# Patient Record
Sex: Female | Born: 1950 | Race: Black or African American | Hispanic: No | Smoking: Never smoker
Health system: Southern US, Community
[De-identification: ages and names within clinical notes are randomized; demographics above are authoritative.]

## PROBLEM LIST (undated history)

## (undated) DIAGNOSIS — E785 Hyperlipidemia, unspecified: Secondary | ICD-10-CM

## (undated) DIAGNOSIS — K649 Unspecified hemorrhoids: Secondary | ICD-10-CM

## (undated) DIAGNOSIS — G5703 Lesion of sciatic nerve, bilateral lower limbs: Secondary | ICD-10-CM

## (undated) DIAGNOSIS — M1990 Unspecified osteoarthritis, unspecified site: Secondary | ICD-10-CM

## (undated) DIAGNOSIS — F419 Anxiety disorder, unspecified: Secondary | ICD-10-CM

## (undated) DIAGNOSIS — Z8619 Personal history of other infectious and parasitic diseases: Secondary | ICD-10-CM

## (undated) DIAGNOSIS — H8110 Benign paroxysmal vertigo, unspecified ear: Secondary | ICD-10-CM

## (undated) DIAGNOSIS — K219 Gastro-esophageal reflux disease without esophagitis: Secondary | ICD-10-CM

## (undated) DIAGNOSIS — F329 Major depressive disorder, single episode, unspecified: Secondary | ICD-10-CM

## (undated) DIAGNOSIS — F32A Depression, unspecified: Secondary | ICD-10-CM

## (undated) DIAGNOSIS — G4730 Sleep apnea, unspecified: Secondary | ICD-10-CM

## (undated) DIAGNOSIS — I10 Essential (primary) hypertension: Secondary | ICD-10-CM

## (undated) HISTORY — DX: Hyperlipidemia, unspecified: E78.5

## (undated) HISTORY — DX: Essential (primary) hypertension: I10

## (undated) HISTORY — DX: Unspecified osteoarthritis, unspecified site: M19.90

## (undated) HISTORY — DX: Anxiety disorder, unspecified: F41.9

## (undated) HISTORY — DX: Major depressive disorder, single episode, unspecified: F32.9

## (undated) HISTORY — DX: Depression, unspecified: F32.A

## (undated) HISTORY — PX: JOINT REPLACEMENT: SHX530

## (undated) HISTORY — DX: Sleep apnea, unspecified: G47.30

## (undated) HISTORY — PX: ABDOMINAL HYSTERECTOMY: SHX81

---

## 2005-11-26 HISTORY — PX: SHOULDER ARTHROSCOPY: SHX128

## 2007-11-27 HISTORY — PX: TOTAL KNEE ARTHROPLASTY: SHX125

## 2011-11-27 HISTORY — PX: BARIATRIC SURGERY: SHX1103

## 2015-03-01 ENCOUNTER — Ambulatory Visit (INDEPENDENT_AMBULATORY_CARE_PROVIDER_SITE_OTHER): Payer: BC Managed Care – PPO | Admitting: Internal Medicine

## 2015-03-01 ENCOUNTER — Encounter: Payer: Self-pay | Admitting: Internal Medicine

## 2015-03-01 ENCOUNTER — Ambulatory Visit (INDEPENDENT_AMBULATORY_CARE_PROVIDER_SITE_OTHER)
Admission: RE | Admit: 2015-03-01 | Discharge: 2015-03-01 | Disposition: A | Discharge status: Home / Self Care | Payer: BC Managed Care – PPO | Source: Ambulatory Visit | Attending: Internal Medicine | Admitting: Internal Medicine

## 2015-03-01 VITALS — BP 140/88 | HR 60 | Temp 98.4°F | Resp 14 | Ht 64.0 in | Wt 263.2 lb

## 2015-03-01 DIAGNOSIS — K219 Gastro-esophageal reflux disease without esophagitis: Secondary | ICD-10-CM

## 2015-03-01 DIAGNOSIS — M159 Polyosteoarthritis, unspecified: Secondary | ICD-10-CM

## 2015-03-01 DIAGNOSIS — G4733 Obstructive sleep apnea (adult) (pediatric): Secondary | ICD-10-CM

## 2015-03-01 DIAGNOSIS — M150 Primary generalized (osteo)arthritis: Secondary | ICD-10-CM

## 2015-03-01 DIAGNOSIS — M25552 Pain in left hip: Secondary | ICD-10-CM | POA: Diagnosis not present

## 2015-03-01 NOTE — Patient Instructions (Signed)
Please schedule a visit with the sports medicine doctor. We will take an x-ray today to make sure nothing has changed from 1 year ago.   We will have you go to the lung doctor about the CPAP, the surgeon about the gas problem, and an orthopedic surgeon for the knees.   Come back in about 6 months to check on the health (we will check the cholesterol then). Keep working on losing weight as every 1 pound lost takes 4 pounds of pressure off the knees.   For exercise water aerobics will be easier on the joints and can still give you a good workout.   Trochanteric Bursitis You have hip pain due to trochanteric bursitis. Bursitis means that the sack near the outside of the hip is filled with fluid and inflamed. This sack is made up of protective soft tissue. The pain from trochanteric bursitis can be severe and keep you from sleep. It can radiate to the buttocks or down the outside of the thigh to the knee. The pain is almost always worse when rising from the seated or lying position and with walking. Pain can improve after you take a few steps. It happens more often in people with hip joint and lumbar spine problems, such as arthritis or previous surgery. Very rarely the trochanteric bursa can become infected, and antibiotics and/or surgery may be needed. Treatment often includes an injection of local anesthetic mixed with cortisone medicine. This medicine is injected into the area where it is most tender over the hip. Repeat injections may be necessary if the response to treatment is slow. You can apply ice packs over the tender area for 30 minutes every 2 hours for the next few days. Anti-inflammatory and/or narcotic pain medicine may also be helpful. Limit your activity for the next few days if the pain continues. See your caregiver in 5-10 days if you are not greatly improved.  SEEK IMMEDIATE MEDICAL CARE IF:  You develop severe pain, fever, or increased redness.  You have pain that radiates below the  knee. EXERCISES STRETCHING EXERCISES - Trochanteric Bursitis  These exercises may help you when beginning to rehabilitate your injury. Your symptoms may resolve with or without further involvement from your physician, physical therapist, or athletic trainer. While completing these exercises, remember:   Restoring tissue flexibility helps normal motion to return to the joints. This allows healthier, less painful movement and activity.  An effective stretch should be held for at least 30 seconds.  A stretch should never be painful. You should only feel a gentle lengthening or release in the stretched tissue. STRETCH - Iliotibial Band  On the floor or bed, lie on your side so your injured leg is on top. Bend your knee and grab your ankle.  Slowly bring your knee back so that your thigh is in line with your trunk. Keep your heel at your buttocks and gently arch your back so your head, shoulders and hips line up.  Slowly lower your leg so that your knee approaches the floor/bed until you feel a gentle stretch on the outside of your thigh. If you do not feel a stretch and your knee will not fall farther, place the heel of your opposite foot on top of your knee and pull your thigh down farther.  Hold this stretch for __________ seconds.  Repeat __________ times. Complete this exercise __________ times per day. STRETCH - Hamstrings, Supine   Lie on your back. Loop a belt or towel over the  ball of your foot as shown.  Straighten your knee and slowly pull on the belt to raise your injured leg. Do not allow the knee to bend. Keep your opposite leg flat on the floor.  Raise the leg until you feel a gentle stretch behind your knee or thigh. Hold this position for __________ seconds.  Repeat __________ times. Complete this stretch __________ times per day. STRETCH - Quadriceps, Prone   Lie on your stomach on a firm surface, such as a bed or padded floor.  Bend your knee and grasp your ankle. If  you are unable to reach your ankle or pant leg, use a belt around your foot to lengthen your reach.  Gently pull your heel toward your buttocks. Your knee should not slide out to the side. You should feel a stretch in the front of your thigh and/or knee.  Hold this position for __________ seconds.  Repeat __________ times. Complete this stretch __________ times per day. STRETCHING - Hip Flexors, Lunge Half kneel with your knee on the floor and your opposite knee bent and directly over your ankle.  Keep good posture with your head over your shoulders. Tighten your buttocks to point your tailbone downward; this will prevent your back from arching too much.  You should feel a gentle stretch in the front of your thigh and/or hip. If you do not feel any resistance, slightly slide your opposite foot forward and then slowly lunge forward so your knee once again lines up over your ankle. Be sure your tailbone remains pointed downward.  Hold this stretch for __________ seconds.  Repeat __________ times. Complete this stretch __________ times per day. STRETCH - Adductors, Lunge  While standing, spread your legs.  Lean away from your injured leg by bending your opposite knee. You may rest your hands on your thigh for balance.  You should feel a stretch in your inner thigh. Hold for __________ seconds.  Repeat __________ times. Complete this exercise __________ times per day. Document Released: 12/20/2004 Document Revised: 03/29/2014 Document Reviewed: 02/24/2009 [facility] Patient Information 2015 [place]. This information is not intended to replace advice given to you by your health care provider. Make sure you discuss any questions you have with your health care provider.

## 2015-03-01 NOTE — Progress Notes (Signed)
Pre visit review using our clinic review tool, if applicable. No additional management support is needed unless otherwise documented below in the visit note. 

## 2015-03-03 ENCOUNTER — Telehealth: Payer: Self-pay | Admitting: Internal Medicine

## 2015-03-03 DIAGNOSIS — M25559 Pain in unspecified hip: Secondary | ICD-10-CM

## 2015-03-03 DIAGNOSIS — Z8669 Personal history of other diseases of the nervous system and sense organs: Secondary | ICD-10-CM | POA: Insufficient documentation

## 2015-03-03 DIAGNOSIS — K219 Gastro-esophageal reflux disease without esophagitis: Secondary | ICD-10-CM | POA: Insufficient documentation

## 2015-03-03 DIAGNOSIS — G4733 Obstructive sleep apnea (adult) (pediatric): Secondary | ICD-10-CM | POA: Insufficient documentation

## 2015-03-03 NOTE — Telephone Encounter (Signed)
Patient does not want to see Dr. Katrinka BlazingSmith or Sports med.

## 2015-03-03 NOTE — Assessment & Plan Note (Signed)
Referral to pulmonology as her CPAP needs some parts. She does have records from her pulmonologist which will be reviewed and scanned into records.

## 2015-03-03 NOTE — Telephone Encounter (Signed)
Will you please put in order for an orthopedic surgeon. This patient is in a hurry to get some treatment for her pain.

## 2015-03-03 NOTE — Telephone Encounter (Signed)
Done but still recommend seeing Dr. Katrinka BlazingSmith.

## 2015-03-03 NOTE — Assessment & Plan Note (Signed)
Per chart review history of hypertension but not hypertensive today and not on BP medication. Will check labs next time to check for complications of the obesity. Did talk with her about the fact that her weight could be causing her joint discomfort. She is aware but is not as focused on weight loss today with her acute pain. Will continue to discuss at next visit as she is pre-contemplative for changes today.

## 2015-03-03 NOTE — Telephone Encounter (Signed)
Patient called back.  She cancelled appointment with Dr. Katrinka BlazingSmith.  She states she only wants referral for Orthopedic surgeon.  She is requesting a call back in regards to what she can do for pain while she waits on referral and she would like referral to go through as soon as possible.

## 2015-03-03 NOTE — Telephone Encounter (Signed)
Left message informing patient that the orders have been put in for ortho.

## 2015-03-03 NOTE — Addendum Note (Signed)
Addended by: Genella MechKOLLAR, ELIZABETH A on: 03/03/2015 08:03 PM   Modules accepted: Level of Service

## 2015-03-03 NOTE — Assessment & Plan Note (Signed)
Feel could be trochanteric bursitis and recommend sports medicine for initial evaluation. Check pelvis and left hip x-ray for previously not found fractures from the fall. She can continue with naproxen for pain for now.

## 2015-03-03 NOTE — Progress Notes (Signed)
   Subjective:    Patient ID: Cindy Anthony, female    DOB: 08/06/1951, 64 y.o.   MRN: [id number]  HPI The patient is a 64 YO female who is coming in for pain in her pelvic and thigh area. She fell at work about 1 year ago and was told that she bruised her tailbone. She did feel somewhat better in the following months but she has not recovered. She has been using naproxen for pain at home which distracts her from the pain but does not take it away (from 6 to 3/10 with naproxen). She denies any numbness or weakness in her legs. She denies them giving out on her. Denies incontinence of urine or bowels. Denies fevers or chills.   [facility], social history reviewed and updated.   Review of Systems  Constitutional: Positive for appetite change. Negative for fever, activity change, fatigue and unexpected weight change.  HENT: Negative.   Eyes: Negative.   Respiratory: Negative for cough, chest tightness, shortness of breath and wheezing.   Cardiovascular: Negative for chest pain, palpitations and leg swelling.  Gastrointestinal: Negative for nausea, abdominal pain, diarrhea, constipation and abdominal distention.  Musculoskeletal: Positive for myalgias, back pain and arthralgias. Negative for gait problem.  Skin: Negative.   Neurological: Negative.   Psychiatric/Behavioral: Negative.       Objective:   Physical Exam  Constitutional: She is oriented to person, place, and time. She appears well-developed and well-nourished.  overweight  HENT:  Head: Normocephalic and atraumatic.  Eyes: EOM are normal.  Neck: Normal range of motion.  Cardiovascular: Normal rate and regular rhythm.   Pulmonary/Chest: Effort normal and breath sounds normal. No respiratory distress. She has no wheezes. She has no rales.  Abdominal: Soft. Bowel sounds are normal. She exhibits no distension. There is no tenderness. There is no rebound.  Musculoskeletal:  Most tenderness on the lateral thigh left leg but mild  tenderness in the left groin as well.   Neurological: She is alert and oriented to person, place, and time. Coordination normal.  Skin: Skin is warm and dry.   Filed Vitals:   03/01/15 0919  BP: 140/88  Pulse: 60  Temp: 98.4 F (36.9 C)  TempSrc: Oral  Resp: 14  Height: 5\' 4"  (1.626 m)  Weight: 263 lb 3.2 oz (119.387 kg)  SpO2: 97%      Assessment & Plan:

## 2015-03-03 NOTE — Assessment & Plan Note (Signed)
Takes omeprazole as needed for GERD symptoms. No warning signs to suggest need for endoscopy at this time.

## 2015-03-03 NOTE — Telephone Encounter (Signed)
Cindy Anthony got patient to contact front desk to see if patient could get in sooner with Dr. Katrinka Anthony.  Patient is in a lot of pain.  Did notify patient that Dr. Katrinka Anthony would not be back until the 12th.  Did offer to see if Dr. Dorise Anthony would enter a referral for another office.  Patient did not want to wait that long.  Please contact patient when back in the office.

## 2015-03-07 ENCOUNTER — Ambulatory Visit: Payer: Self-pay | Admitting: Internal Medicine

## 2015-03-18 ENCOUNTER — Ambulatory Visit: Payer: BC Managed Care – PPO | Admitting: Family Medicine

## 2015-03-22 ENCOUNTER — Telehealth: Payer: Self-pay | Admitting: Internal Medicine

## 2015-03-22 NOTE — Telephone Encounter (Signed)
Patient states she needs referral for GI.  States she got a call from a bariatric office stating there was a referral sent over from our office for her.  Patient states she did not need a referral for this.  Patient is requesting call back in regards.

## 2015-03-23 NOTE — Telephone Encounter (Signed)
Left patient a message to call me back.

## 2015-03-24 NOTE — Telephone Encounter (Signed)
Patient has bowel and gas problems. She thinks its coming from the bariatric surgery that was done a while ago. She is asking for a referral to GI.

## 2015-03-25 NOTE — Telephone Encounter (Signed)
We would like her to follow up with the surgeon to make sure there are no problems from her previous procedure and if no problems then GI referral is appropriate.

## 2015-04-06 ENCOUNTER — Encounter: Payer: Self-pay | Admitting: Internal Medicine

## 2015-04-11 ENCOUNTER — Encounter: Payer: Self-pay | Admitting: Internal Medicine

## 2015-05-07 ENCOUNTER — Encounter: Payer: Self-pay | Admitting: Emergency Medicine

## 2015-05-07 ENCOUNTER — Emergency Department
Admission: EM | Admit: 2015-05-07 | Discharge: 2015-05-07 | Discharge status: Against Medical Advice | Payer: BC Managed Care – PPO | Attending: Emergency Medicine | Admitting: Emergency Medicine

## 2015-05-07 ENCOUNTER — Other Ambulatory Visit: Payer: Self-pay

## 2015-05-07 DIAGNOSIS — I10 Essential (primary) hypertension: Secondary | ICD-10-CM | POA: Insufficient documentation

## 2015-05-07 DIAGNOSIS — R079 Chest pain, unspecified: Secondary | ICD-10-CM | POA: Diagnosis not present

## 2015-05-07 DIAGNOSIS — K088 Other specified disorders of teeth and supporting structures: Secondary | ICD-10-CM | POA: Insufficient documentation

## 2015-05-07 LAB — COMPREHENSIVE METABOLIC PANEL
ALBUMIN: 4.2 g/dL (ref 3.5–5.0)
ALT: 21 U/L (ref 14–54)
AST: 19 U/L (ref 15–41)
Alkaline Phosphatase: 83 U/L (ref 38–126)
Anion gap: 9 (ref 5–15)
BUN: 14 mg/dL (ref 6–20)
CALCIUM: 9.3 mg/dL (ref 8.9–10.3)
CHLORIDE: 105 mmol/L (ref 101–111)
CO2: 25 mmol/L (ref 22–32)
CREATININE: 0.76 mg/dL (ref 0.44–1.00)
GFR calc Af Amer: >60 mL/min (ref >60)
GFR calc non Af Amer: >60 mL/min (ref >60)
Glucose, Bld: 86 mg/dL (ref 65–99)
Potassium: 3.5 mmol/L (ref 3.5–5.1)
Sodium: 139 mmol/L (ref 135–145)
Total Bilirubin: 1 mg/dL (ref 0.3–1.2)
Total Protein: 7.4 g/dL (ref 6.5–8.1)

## 2015-05-07 LAB — CBC
HCT: 44.5 % (ref 35.0–47.0)
HEMOGLOBIN: 14.6 g/dL (ref 12.0–16.0)
MCH: 29.6 pg (ref 26.0–34.0)
MCHC: 32.7 g/dL (ref 32.0–36.0)
MCV: 90.6 fL (ref 80.0–100.0)
PLATELETS: 218 10*3/uL (ref 150–440)
RBC: 4.92 MIL/uL (ref 3.80–5.20)
RDW: 14.7 % — ABNORMAL HIGH (ref 11.5–14.5)
WBC: 7.2 10*3/uL (ref 3.6–11.0)

## 2015-05-07 LAB — TROPONIN I: Troponin I: <0.03 ng/mL (ref <0.031)

## 2015-05-07 NOTE — ED Notes (Signed)
Denies SOB.

## 2015-05-07 NOTE — ED Notes (Signed)
This Rn went in to check on pt. Pt stated " Are you the doctor?" this Rn stated that I ws her nurse and inquired if she needed anything. Pt stated " the doctor better hurry up or I am going to walk out of here."

## 2015-05-07 NOTE — ED Notes (Signed)
PT states having "on and off chest pain X 2 days." Pt denies chest pain at the moment. Pt reports having a filling in her lower, back left tooth that is causing her pain

## 2015-05-12 ENCOUNTER — Ambulatory Visit (INDEPENDENT_AMBULATORY_CARE_PROVIDER_SITE_OTHER): Payer: BC Managed Care – PPO | Admitting: Family Medicine

## 2015-05-12 ENCOUNTER — Encounter: Payer: Self-pay | Admitting: Family Medicine

## 2015-05-12 VITALS — BP 198/98 | HR 62 | Temp 98.1°F | Wt 259.0 lb

## 2015-05-12 DIAGNOSIS — I10 Essential (primary) hypertension: Secondary | ICD-10-CM | POA: Insufficient documentation

## 2015-05-12 DIAGNOSIS — M25552 Pain in left hip: Secondary | ICD-10-CM | POA: Diagnosis not present

## 2015-05-12 DIAGNOSIS — K0889 Other specified disorders of teeth and supporting structures: Secondary | ICD-10-CM

## 2015-05-12 DIAGNOSIS — K088 Other specified disorders of teeth and supporting structures: Secondary | ICD-10-CM

## 2015-05-12 MED ORDER — TRAMADOL HCL 50 MG PO TABS
50.0000 mg | ORAL_TABLET | Freq: Four times a day (QID) | ORAL | Status: DC | PRN
Start: 2015-05-12 — End: 2015-05-27

## 2015-05-12 MED ORDER — AMLODIPINE BESYLATE 5 MG PO TABS: 5.0000 mg | ORAL_TABLET | Freq: Every day | ORAL | Status: DC

## 2015-05-12 NOTE — Patient Instructions (Signed)
DASH Eating Plan DASH stands for "Dietary Approaches to Stop Hypertension." The DASH eating plan is a healthy eating plan that has been shown to reduce high blood pressure (hypertension). Additional health benefits may include reducing the risk of type 2 diabetes mellitus, heart disease, and stroke. The DASH eating plan may also help with weight loss. WHAT DO I NEED TO KNOW ABOUT THE DASH EATING PLAN? For the DASH eating plan, you will follow these general guidelines:  Choose foods with a percent daily value for sodium of less than 5% (as listed on the food label).  Use salt-free seasonings or herbs instead of table salt or sea salt.  Check with your health care provider or pharmacist before using salt substitutes.  Eat lower-sodium products, often labeled as "lower sodium" or "no salt added."  Eat fresh foods.  Eat more vegetables, fruits, and low-fat dairy products.  Choose whole grains. Look for the word "whole" as the first word in the ingredient list.  Choose fish and skinless chicken or turkey more often than red meat. Limit fish, poultry, and meat to 6 oz (170 g) each day.  Limit sweets, desserts, sugars, and sugary drinks.  Choose heart-healthy fats.  Limit cheese to 1 oz (28 g) per day.  Eat more home-cooked food and less restaurant, buffet, and fast food.  Limit fried foods.  Cook foods using methods other than frying.  Limit canned vegetables. If you do use them, rinse them well to decrease the sodium.  When eating at a restaurant, ask that your food be prepared with less salt, or no salt if possible. WHAT FOODS CAN I EAT? Seek help from a dietitian for individual calorie needs. Grains Whole grain or whole wheat bread. Brown rice. Whole grain or whole wheat pasta. Quinoa, bulgur, and whole grain cereals. Low-sodium cereals. Corn or whole wheat flour tortillas. Whole grain cornbread. Whole grain crackers. Low-sodium crackers. Vegetables Fresh or frozen vegetables  (raw, steamed, roasted, or grilled). Low-sodium or reduced-sodium tomato and vegetable juices. Low-sodium or reduced-sodium tomato sauce and paste. Low-sodium or reduced-sodium canned vegetables.  Fruits All fresh, canned (in natural juice), or frozen fruits. Meat and Other Protein Products Ground beef (85% or leaner), grass-fed beef, or beef trimmed of fat. Skinless chicken or turkey. Ground chicken or turkey. Pork trimmed of fat. All fish and seafood. Eggs. Dried beans, peas, or lentils. Unsalted nuts and seeds. Unsalted canned beans. Dairy Low-fat dairy products, such as skim or 1% milk, 2% or reduced-fat cheeses, low-fat ricotta or cottage cheese, or plain low-fat yogurt. Low-sodium or reduced-sodium cheeses. Fats and Oils Tub margarines without trans fats. Light or reduced-fat mayonnaise and salad dressings (reduced sodium). Avocado. Safflower, olive, or canola oils. Natural peanut or almond butter. Other Unsalted popcorn and pretzels. The items listed above may not be a complete list of recommended foods or beverages. Contact your dietitian for more options. WHAT FOODS ARE NOT RECOMMENDED? Grains White bread. White pasta. White rice. Refined cornbread. Bagels and croissants. Crackers that contain trans fat. Vegetables Creamed or fried vegetables. Vegetables in a cheese sauce. Regular canned vegetables. Regular canned tomato sauce and paste. Regular tomato and vegetable juices. Fruits Dried fruits. Canned fruit in light or heavy syrup. Fruit juice. Meat and Other Protein Products Fatty cuts of meat. Ribs, chicken wings, bacon, sausage, bologna, salami, chitterlings, fatback, hot dogs, bratwurst, and packaged luncheon meats. Salted nuts and seeds. Canned beans with salt. Dairy Whole or 2% milk, cream, half-and-half, and cream cheese. Whole-fat or sweetened yogurt. Full-fat   cheeses or blue cheese. Nondairy creamers and whipped toppings. Processed cheese, cheese spreads, or cheese  curds. Condiments Onion and garlic salt, seasoned salt, table salt, and sea salt. Canned and packaged gravies. Worcestershire sauce. Tartar sauce. Barbecue sauce. Teriyaki sauce. Soy sauce, including reduced sodium. Steak sauce. Fish sauce. Oyster sauce. Cocktail sauce. Horseradish. Ketchup and mustard. Meat flavorings and tenderizers. Bouillon cubes. Hot sauce. Tabasco sauce. Marinades. Taco seasonings. Relishes. Fats and Oils Butter, stick margarine, lard, shortening, ghee, and bacon fat. Coconut, palm kernel, or palm oils. Regular salad dressings. Other Pickles and olives. Salted popcorn and pretzels. The items listed above may not be a complete list of foods and beverages to avoid. Contact your dietitian for more information. WHERE CAN I FIND MORE INFORMATION? National Heart, Lung, and Blood Institute: [employer] Document Released: 11/01/2011 Document Revised: 03/29/2014 Document Reviewed: 09/16/2013 [facility] Patient Information 2015 [place], [place]. This information is not intended to replace advice given to you by your health care provider. Make sure you discuss any questions you have with your health care provider.  STOP the Indomethacin and do not take any other NSAIDS Make sure you follow up with your primary in 2-3 weeks for follow up.

## 2015-05-12 NOTE — Progress Notes (Signed)
Pre visit review using our clinic review tool, if applicable. No additional management support is needed unless otherwise documented below in the visit note. 

## 2015-05-12 NOTE — Progress Notes (Signed)
   Subjective:    Patient ID: Cindy Anthony, female    DOB: [birth year], 64 y.o.   MRN: [id number]  HPI Patient seen with concerns for elevated blood pressure. She has apparently had some borderline elevations in the past. She was placed recently on indomethacin per her orthopedist for some left hip pain and she thinks that may be exacerbating. She feels somewhat dizzy and lightheaded at times. Occasional headaches. No peripheral edema.  Patient also had root canal left lower molar just a few days ago. She's had some oral pains and was taking indomethacin for that as well. She is nonsmoker. No alcohol use. Tries to watch her sodium intake closely.  Past Medical History  Diagnosis Date  . Arthritis   . Depression   . Hyperlipidemia   . Hypertension    Past Surgical History  Procedure Laterality Date  . Abdominal hysterectomy    . Total knee arthroplasty Right 2009  . Shoulder arthroscopy Right 2007  . Bariatric surgery  2013    sleeve    reports that she has never smoked. She does not have any smokeless tobacco history on file. She reports that she does not drink alcohol or use illicit drugs. family history includes Arthritis in her father and mother; Diabetes in her mother and paternal grandmother; Heart disease in her father and mother; Hyperlipidemia in her mother; Hypertension in her mother; Kidney disease in her mother; Stroke in her mother. No Known Allergies    Review of Systems  Constitutional: Positive for fatigue.  Eyes: Negative for visual disturbance.  Respiratory: Negative for cough, chest tightness, shortness of breath and wheezing.   Cardiovascular: Negative for chest pain, palpitations and leg swelling.  Endocrine: Negative for polydipsia and polyuria.  Musculoskeletal: Positive for arthralgias.  Neurological: Positive for dizziness and light-headedness. Negative for seizures, syncope, weakness and headaches.       Objective:   Physical Exam  Constitutional:  She appears well-developed and well-nourished.  Eyes: Pupils are equal, round, and reactive to light.  Neck: Neck supple. No thyromegaly present.  Cardiovascular: Normal rate and regular rhythm.  Exam reveals no gallop.   Pulmonary/Chest: Effort normal and breath sounds normal. No respiratory distress. She has no wheezes. She has no rales.  Musculoskeletal: She exhibits no edema.  Lymphadenopathy:    She has no cervical adenopathy.          Assessment & Plan:  #1 elevated blood pressure. Initial reading today by nurse 160/92 and I obtained 198/98 left arm seated at rest and same reading right arm seated at rest. Probably exacerbated by indomethacin. Information on DASH diet given. Given degree of elevation today go ahead and start amlodipine 5 mg once daily. Strongly encouraged to follow-up with primary in 2-3 weeks. Avoid all other non-steroidal's. #2 recent left hip and tooth pain. Avoid NSAIDS as above. Limited tramadol 50 mg 1-2 every 6 hours as needed for severe pain

## 2015-05-13 ENCOUNTER — Ambulatory Visit: Payer: BC Managed Care – PPO | Admitting: Family Medicine

## 2015-05-13 ENCOUNTER — Telehealth: Payer: Self-pay | Admitting: Internal Medicine

## 2015-05-13 NOTE — Telephone Encounter (Signed)
Patient informed. 

## 2015-05-13 NOTE — Telephone Encounter (Signed)
I wish that I could but can not take on any new patients at this time

## 2015-05-13 NOTE — Telephone Encounter (Signed)
Patient seen Dr. Caryl Never yesterday and is requesting to transfer to him.

## 2015-05-25 ENCOUNTER — Ambulatory Visit (INDEPENDENT_AMBULATORY_CARE_PROVIDER_SITE_OTHER): Payer: BC Managed Care – PPO | Admitting: Pulmonary Disease

## 2015-05-25 ENCOUNTER — Encounter: Payer: Self-pay | Admitting: Pulmonary Disease

## 2015-05-25 VITALS — BP 120/78 | HR 62 | Ht 64.0 in | Wt 258.8 lb

## 2015-05-25 DIAGNOSIS — G4733 Obstructive sleep apnea (adult) (pediatric): Secondary | ICD-10-CM

## 2015-05-25 NOTE — Progress Notes (Signed)
Subjective:    Patient ID: Cindy Anthony, female    DOB: 08/08/1951, 64 y.o.   MRN: [id number]  HPI  64 year old presents to establish care for obstructive sleep apnea. [place] works as a Education officer, environmentalcollege instructor. She was diagnosed with OSA in 2008, maintained on CPAP 7 cm with a small nasal mask. She has a Cabin crewfisher paykel machine,which has gotten old, DME was [facility]-she would like to change. She underwent gastric bypass in 2010 and lost about 15 pounds, her peak weight was 288 she is now down to 259.  Epworth sleepiness score is 2 Bedtime is around 11 PM, sleep latency about 30 minutes, to an hour she sleeps on her side with 2 pillows, head of bed is raised, no nocturnal awakenings or nocturia and is out of bed by 7 AM rested without dryness of mouth. There is no history suggestive of cataplexy, sleep paralysis or parasomnias   Past Medical History  Diagnosis Date  . Arthritis   . Depression   . Hyperlipidemia   . Hypertension     Past Surgical History  Procedure Laterality Date  . Abdominal hysterectomy    . Total knee arthroplasty Right 2009  . Shoulder arthroscopy Right 2007  . Bariatric surgery  2013    sleeve   No Known Allergies  History   Social History  . Marital Status: Divorced    Spouse Name: N/A  . Number of Children: N/A  . Years of Education: N/A   Occupational History  . Not on file.   Social History Main Topics  . Smoking status: Never Smoker   . Smokeless tobacco: Not on file  . Alcohol Use: No  . Drug Use: No  . Sexual Activity: Not on file   Other Topics Concern  . Not on file   Social History Narrative    Family History  Problem Relation Age of Onset  . Arthritis Mother   . Hyperlipidemia Mother   . Heart disease Mother   . Stroke Mother   . Hypertension Mother   . Kidney disease Mother   . Diabetes Mother   . Arthritis Father   . Heart disease Father   . Diabetes Paternal Grandmother        Review of Systems  Constitutional: Negative for fever, chills and unexpected weight change.  HENT: Negative for congestion, dental problem, ear pain, nosebleeds, postnasal drip, rhinorrhea, sinus pressure, sneezing, sore throat, trouble swallowing and voice change.   Eyes: Negative for visual disturbance.  Respiratory: Positive for apnea. Negative for cough, choking and shortness of breath.   Cardiovascular: Negative for chest pain and leg swelling.  Gastrointestinal: Negative for vomiting, abdominal pain and diarrhea.  Genitourinary: Negative for difficulty urinating.  Musculoskeletal: Negative for arthralgias.  Skin: Negative for rash.  Neurological: Negative for tremors, syncope and headaches.  Hematological: Does not bruise/bleed easily.       Objective:   Physical Exam  Gen. Pleasant, obese, in no distress, normal affect ENT - no lesions, no post nasal drip, class 2-3 airway Neck: No JVD, no thyromegaly, no carotid bruits Lungs: no use of accessory muscles, no dullness to percussion, decreased without rales or rhonchi  Cardiovascular: Rhythm regular, heart sounds  normal, no murmurs or gallops, no peripheral edema Abdomen: soft and non-tender, no hepatosplenomegaly, BS normal. Musculoskeletal: No deformities, no cyanosis or clubbing Neuro:  alert, non focal, no tremors       Assessment & Plan:

## 2015-05-25 NOTE — Patient Instructions (Signed)
Obtain sleep studies from Dr Allena KatzPatel [facility] [place] Based on this, we will send Rx for new CPAP to local DME & get you new supplies

## 2015-05-25 NOTE — Assessment & Plan Note (Signed)
Obtain sleep studies from Dr Allena KatzPatel [facility] [place] Based on this, we will send Rx for new CPAP to local DME & get you new supplies  Weight loss encouraged, compliance with goal of at least 4-6 hrs every night is the expectation. Advised against medications with sedative side effects Cautioned against driving when sleepy - understanding that sleepiness will vary on a day to day basis

## 2015-05-27 ENCOUNTER — Encounter: Payer: Self-pay | Admitting: Internal Medicine

## 2015-05-27 ENCOUNTER — Ambulatory Visit (INDEPENDENT_AMBULATORY_CARE_PROVIDER_SITE_OTHER): Payer: BC Managed Care – PPO | Admitting: Internal Medicine

## 2015-05-27 VITALS — BP 136/76 | HR 68 | Temp 98.4°F | Resp 14 | Ht 64.0 in | Wt 258.0 lb

## 2015-05-27 DIAGNOSIS — I10 Essential (primary) hypertension: Secondary | ICD-10-CM | POA: Diagnosis not present

## 2015-05-27 NOTE — Assessment & Plan Note (Signed)
She does not wish to continue with medication if not needed. Advised that we can observe her off and she will check pressures at home and we can restart the amlodipine if she is going up. Talked to her about continuing with weight loss which will help keep her off medicine. No need for BMP today.

## 2015-05-27 NOTE — Progress Notes (Signed)
Pre visit review using our clinic review tool, if applicable. No additional management support is needed unless otherwise documented below in the visit note. 

## 2015-05-27 NOTE — Patient Instructions (Signed)
When you run out of the amlodipine you can stop taking it. Check the blood pressure a couple times a week and if it starts to go up to 150 or 160 or higher go back on the medicine.   Think about eating smaller meals to help with the gas and bloating.   Come back as scheduled late fall for a check up.   Keep up the good work with the weight! You are doing great!

## 2015-05-27 NOTE — Progress Notes (Signed)
   Subjective:    Patient ID: Cindy Anthony, female    DOB: 08/17/1951, 64 y.o.   MRN: [id number]  HPI The patient is a 64 YO female who is coming in for follow up of her blood pressure. She was treated with NSAID for hip pain which elevated her pressure. She is no longer on that medicine and hip doing better. She was started on amlodipine and she wants to know if she needs to continue. She is down about 5 pounds since last visit. No headaches, chest pains.   Review of Systems  Constitutional: Positive for appetite change. Negative for fever, activity change, fatigue and unexpected weight change.  Respiratory: Negative for cough, chest tightness, shortness of breath and wheezing.   Cardiovascular: Negative for chest pain, palpitations and leg swelling.  Gastrointestinal: Negative for nausea, abdominal pain, diarrhea, constipation and abdominal distention.  Musculoskeletal: Positive for myalgias. Negative for gait problem.  Neurological: Negative.       Objective:   Physical Exam  Constitutional: She is oriented to person, place, and time. She appears well-developed and well-nourished.  overweight  HENT:  Head: Normocephalic and atraumatic.  Eyes: EOM are normal.  Neck: Normal range of motion.  Cardiovascular: Normal rate and regular rhythm.   Pulmonary/Chest: Effort normal and breath sounds normal. No respiratory distress. She has no wheezes. She has no rales.  Abdominal: Soft. Bowel sounds are normal. She exhibits no distension. There is no tenderness. There is no rebound.  Neurological: She is alert and oriented to person, place, and time. Coordination normal.  Skin: Skin is warm and dry.   Filed Vitals:   05/27/15 1320  BP: 136/76  Pulse: 68  Temp: 98.4 F (36.9 C)  TempSrc: Oral  Resp: 14  Height: 5\' 4"  (1.626 m)  Weight: 258 lb (117.028 kg)  SpO2: 98%      Assessment & Plan:

## 2015-05-31 ENCOUNTER — Telehealth: Payer: Self-pay | Admitting: Pulmonary Disease

## 2015-05-31 DIAGNOSIS — G4733 Obstructive sleep apnea (adult) (pediatric): Secondary | ICD-10-CM

## 2015-05-31 NOTE — Telephone Encounter (Signed)
lmtcb

## 2015-05-31 NOTE — Telephone Encounter (Signed)
02/2009  CPAP titration >> 7 cm Pl [facility]

## 2015-06-01 NOTE — Telephone Encounter (Signed)
Patient notified. CPAP ordered. Nothing further needed.

## 2015-06-01 NOTE — Telephone Encounter (Signed)
[phone] or [phone], pt cb

## 2015-06-08 ENCOUNTER — Encounter: Payer: Self-pay | Admitting: Internal Medicine

## 2015-06-08 ENCOUNTER — Ambulatory Visit (INDEPENDENT_AMBULATORY_CARE_PROVIDER_SITE_OTHER): Payer: BC Managed Care – PPO | Admitting: Internal Medicine

## 2015-06-08 VITALS — BP 128/80 | HR 58 | Ht 63.0 in | Wt 257.2 lb

## 2015-06-08 DIAGNOSIS — E669 Obesity, unspecified: Secondary | ICD-10-CM

## 2015-06-08 DIAGNOSIS — R140 Abdominal distension (gaseous): Secondary | ICD-10-CM | POA: Diagnosis not present

## 2015-06-08 DIAGNOSIS — K219 Gastro-esophageal reflux disease without esophagitis: Secondary | ICD-10-CM | POA: Diagnosis not present

## 2015-06-08 DIAGNOSIS — R143 Flatulence: Secondary | ICD-10-CM | POA: Diagnosis not present

## 2015-06-08 MED ORDER — RIFAXIMIN 550 MG PO TABS: 550.0000 mg | ORAL_TABLET | Freq: Three times a day (TID) | ORAL | Status: DC

## 2015-06-08 NOTE — Progress Notes (Signed)
HISTORY OF PRESENT ILLNESS:  Cindy Anthony is a 64 y.o. female, native of [place], with general medical problems as listed below who is referred today by her primary care provider, Dr. Genella MechElizabeth Kollar, regarding complaints of increased intestinal gas manifested principally by belching and flatus. The [place] in February 2016, from [place], as a Education officer, environmentalcollege instructor. The majority of her health [place] [facility] [place], but no records available for review (have been requested). She tells me that she underwent bariatric surgery in the form of gastric sleeve in 2011. Unfortunately, no meaningful weight loss thereafter. Since that time she does report problems with increased intestinal gas as manifested by belching and foul-smelling flatus. She also has a history of GERD as manifested by intermittent pyrosis for which she takes on demand omeprazole with success. No dysphagia. Her weight over the past year has been stable. She has had several colonoscopies in the past. She denies a history of polyps. No family history of colon cancer. She tells me that her last colonoscopy in 2015 was normal. The patient denies abdominal pain or bleeding. I have reviewed outside records including her primary care providers office note and laboratories. Blood work from 05/07/2015 reveals normal CBC and comprehensive metabolic panel. The patient also reports having undergone upper endoscopy and [facility] [place]  REVIEW OF SYSTEMS:  All non-GI ROS negative except for anxiety, arthritis  Past Medical History  Diagnosis Date  . Arthritis   . Depression   . Hyperlipidemia   . Hypertension   . Anxiety   . Sleep apnea     Past Surgical History  Procedure Laterality Date  . Abdominal hysterectomy    . Total knee arthroplasty Right 2009  . Shoulder arthroscopy Right 2007  . Bariatric surgery  2013    sleeve    Social History Cindy Anthony  reports that she has never smoked. She  does not have any smokeless tobacco history on file. She reports that she does not drink alcohol or use illicit drugs.  family history includes Arthritis in her father and mother; Colon polyps in her mother; Diabetes in her mother and paternal grandmother; Heart disease in her father and mother; Hyperlipidemia in her mother; Hypertension in her mother; Kidney disease in her mother; Stroke in her mother.  No Known Allergies     PHYSICAL EXAMINATION: Vital signs: BP 128/80 mmHg  Pulse 58  Ht 5\' 3"  (1.6 m)  Wt 257 lb 4 oz (116.688 kg)  BMI 45.58 kg/m2  Constitutional: generally well-appearing, no acute distress Psychiatric: alert and oriented x3, cooperative Eyes: extraocular movements intact, anicteric, conjunctiva pink Mouth: oral pharynx moist, no lesions Neck: supple no lymphadenopathy Cardiovascular: heart regular rate and rhythm, no murmur Lungs: clear to auscultation bilaterally Abdomen: soft, obese, nontender, nondistended, no obvious ascites, no peritoneal signs, normal bowel sounds, no organomegaly Rectal: Deferred Extremities: no clubbing cyanosis or lower extremity edema bilaterally Skin: no lesions on visible extremities Neuro: No focal deficits. No asterixis.  ASSESSMENT:  #1. Increased intestinal gas. May have an element of bacterial overgrowth #2. Morbid obesity. Prior gastric sleeve surgery 2011 without resultant weight loss  #3. GERD without alarm features. Managed with on demand PPI #4. Reports colon cancer screening via colonoscopy up-to-date. [place] 2015 [place]  PLAN:  #1. Extensive discussion on the pathophysiology and clinical relevance of increased intestinal gas #2. Provided her with literature on intestinal gas for review #3. Provided her with literature on anti-gas and  flatulence dietary sheet #4. Recommended reflux precautions for GERD. Attention to weight loss #5. Recommended ongoing on demand use of PPI to control intermittent  GERD symptoms #6. Prescribe a trial of Xifaxan 550 mg 3 times a day 2 weeks for possible bacterial overgrowth #7. Review outside records from [place] if they become available #8. Return to the care of your PCP. Interval GI follow-up as needed  A copy of this consultation note has been sent to Dr. Dorise Hiss

## 2015-06-08 NOTE — Patient Instructions (Signed)
We have sent the following medications to your pharmacy for you to pick up at your convenience:  Xifaxan  You have been given some information on gas. 

## 2015-06-15 ENCOUNTER — Telehealth: Payer: Self-pay | Admitting: Pulmonary Disease

## 2015-06-15 NOTE — Telephone Encounter (Signed)
lmtcb X1 for Melissa.  [facility], was only given options to three separate offices.  Wcb.

## 2015-06-16 NOTE — Telephone Encounter (Signed)
Spoke with [facility], states that they have been unsuccessful in getting in touch with [facility] in [place] to retrieve the patients sleep study.  Melissa states that they have called 3 times and left 3 messages to return call and have not heard back. Requests that either the patient or our office attempt to contact the sleep center.  [facility] # [phone] ---- Called [facility] and spoke with rep, sleep study is being faxed to main fax #.  Will hold in triage to keep an eye out to ensure that [facility] gets this study asap.

## 2015-06-16 NOTE — Telephone Encounter (Signed)
lmtcb X2 for [employer]

## 2015-06-17 ENCOUNTER — Telehealth: Payer: Self-pay | Admitting: Internal Medicine

## 2015-06-17 NOTE — Telephone Encounter (Signed)
Yes, in Dr. Reginia Naas folder to be reviewed.

## 2015-06-17 NOTE — Telephone Encounter (Signed)
Cindy Anthony, did you receive results?

## 2015-06-21 NOTE — Telephone Encounter (Signed)
Left detailed message on voicemail advising of pressure setting. Nothing further needed.

## 2015-06-21 NOTE — Telephone Encounter (Signed)
error 

## 2015-06-21 NOTE — Telephone Encounter (Signed)
Reviewed -CPAP 7

## 2015-06-30 ENCOUNTER — Telehealth: Payer: Self-pay | Admitting: Pulmonary Disease

## 2015-06-30 ENCOUNTER — Ambulatory Visit (INDEPENDENT_AMBULATORY_CARE_PROVIDER_SITE_OTHER): Payer: BC Managed Care – PPO | Admitting: Internal Medicine

## 2015-06-30 ENCOUNTER — Other Ambulatory Visit (INDEPENDENT_AMBULATORY_CARE_PROVIDER_SITE_OTHER): Payer: BC Managed Care – PPO

## 2015-06-30 ENCOUNTER — Encounter: Payer: Self-pay | Admitting: Internal Medicine

## 2015-06-30 VITALS — BP 118/72 | HR 66 | Temp 98.5°F | Resp 16 | Ht 63.0 in | Wt 260.0 lb

## 2015-06-30 DIAGNOSIS — G8929 Other chronic pain: Secondary | ICD-10-CM | POA: Diagnosis not present

## 2015-06-30 DIAGNOSIS — R1012 Left upper quadrant pain: Secondary | ICD-10-CM | POA: Diagnosis not present

## 2015-06-30 DIAGNOSIS — R8271 Bacteriuria: Secondary | ICD-10-CM

## 2015-06-30 DIAGNOSIS — R8281 Pyuria: Secondary | ICD-10-CM

## 2015-06-30 DIAGNOSIS — N390 Urinary tract infection, site not specified: Secondary | ICD-10-CM | POA: Diagnosis not present

## 2015-06-30 DIAGNOSIS — R109 Unspecified abdominal pain: Principal | ICD-10-CM

## 2015-06-30 LAB — CBC WITH DIFFERENTIAL/PLATELET
BASOS ABS: 0 10*3/uL (ref 0.0–0.1)
Basophils Relative: 0.2 % (ref 0.0–3.0)
EOS PCT: 0.7 % (ref 0.0–5.0)
Eosinophils Absolute: 0.1 10*3/uL (ref 0.0–0.7)
HCT: 46 % (ref 36.0–46.0)
Hemoglobin: 15.3 g/dL — ABNORMAL HIGH (ref 12.0–15.0)
Lymphocytes Relative: 26.7 % (ref 12.0–46.0)
Lymphs Abs: 2.4 10*3/uL (ref 0.7–4.0)
MCHC: 33.2 g/dL (ref 30.0–36.0)
MCV: 90.5 fl (ref 78.0–100.0)
MONOS PCT: 7.7 % (ref 3.0–12.0)
Monocytes Absolute: 0.7 10*3/uL (ref 0.1–1.0)
NEUTROS PCT: 64.7 % (ref 43.0–77.0)
Neutro Abs: 5.7 10*3/uL (ref 1.4–7.7)
Platelets: 241 10*3/uL (ref 150.0–400.0)
RBC: 5.08 Mil/uL (ref 3.87–5.11)
RDW: 15.3 % (ref 11.5–15.5)
WBC: 8.8 10*3/uL (ref 4.0–10.5)

## 2015-06-30 LAB — COMPREHENSIVE METABOLIC PANEL
ALK PHOS: 104 U/L (ref 39–117)
ALT: 17 U/L (ref 0–35)
AST: 16 U/L (ref 0–37)
Albumin: 4.2 g/dL (ref 3.5–5.2)
BUN: 21 mg/dL (ref 6–23)
CALCIUM: 9.4 mg/dL (ref 8.4–10.5)
CHLORIDE: 105 meq/L (ref 96–112)
CO2: 27 mEq/L (ref 19–32)
CREATININE: 0.84 mg/dL (ref 0.40–1.20)
GFR: 87.65 mL/min (ref >60.00)
Glucose, Bld: 79 mg/dL (ref 70–99)
Potassium: 4.1 mEq/L (ref 3.5–5.1)
Sodium: 140 mEq/L (ref 135–145)
TOTAL PROTEIN: 7.4 g/dL (ref 6.0–8.3)
Total Bilirubin: 0.6 mg/dL (ref 0.2–1.2)

## 2015-06-30 LAB — URINALYSIS, ROUTINE W REFLEX MICROSCOPIC
Bilirubin Urine: NEGATIVE
HGB URINE DIPSTICK: NEGATIVE
Ketones, ur: NEGATIVE
NITRITE: NEGATIVE
Specific Gravity, Urine: 1.025 (ref 1.000–1.030)
TOTAL PROTEIN, URINE-UPE24: NEGATIVE
UROBILINOGEN UA: 0.2 (ref 0.0–1.0)
Urine Glucose: NEGATIVE
pH: 7 (ref 5.0–8.0)

## 2015-06-30 NOTE — Progress Notes (Signed)
Subjective:  Patient ID: Cindy Anthony, female    DOB: [birth year]  Age: 64 y.o. MRN: [id number]  CC: Flank Pain  New to me HPI Cindy Anthony presents for left flank pain for 3 months. She describes an intermittent sharp sensation that radiates into her left groin. She also has chronic left hip pain and she tells me she has been seeing an orthopedic surgeon and has recently had a steroid injection in her left hip.  Outpatient Prescriptions Prior to Visit  Medication Sig Dispense Refill  . LORazepam (ATIVAN) 0.5 MG tablet Take 0.5 mg by mouth 2 (two) times daily.    . naproxen (NAPROSYN) 500 MG tablet Take 500 mg by mouth as needed.    Marland Kitchen omeprazole (PRILOSEC) 40 MG capsule Take 40 mg by mouth daily.    Marland Kitchen amLODipine (NORVASC) 5 MG tablet Take 1 tablet (5 mg total) by mouth daily. 30 tablet 6  . rifaximin (XIFAXAN) 550 MG TABS tablet Take 1 tablet (550 mg total) by mouth 3 (three) times daily. 42 tablet 0   No facility-administered medications prior to visit.    ROS Review of Systems  Constitutional: Negative.  Negative for fever, chills, diaphoresis, activity change, appetite change, fatigue and unexpected weight change.  HENT: Negative.  Negative for trouble swallowing and voice change.   Eyes: Negative.   Respiratory: Negative.  Negative for cough, choking, chest tightness, shortness of breath and stridor.   Cardiovascular: Negative.  Negative for chest pain, palpitations and leg swelling.  Gastrointestinal: Positive for nausea. Negative for vomiting, abdominal pain, diarrhea, constipation and blood in stool.  Endocrine: Negative.   Genitourinary: Positive for flank pain. Negative for dysuria, urgency, frequency, hematuria, decreased urine volume, vaginal discharge, difficulty urinating, vaginal pain and menstrual problem.  Musculoskeletal: Positive for arthralgias. Negative for myalgias, back pain, joint swelling, gait problem and neck pain.  Skin: Negative.  Negative for rash.    Allergic/Immunologic: Negative.   Neurological: Negative.   Hematological: Negative for adenopathy. Does not bruise/bleed easily.  Psychiatric/Behavioral: Negative.     Objective:  BP 118/72 mmHg  Pulse 66  Temp(Src) 98.5 F (36.9 C) (Oral)  Resp 16  Ht 5\' 3"  (1.6 m)  Wt 260 lb (117.935 kg)  BMI 46.07 kg/m2  SpO2 98%  BP Readings from Last 3 Encounters:  06/30/15 118/72  06/08/15 128/80  05/27/15 136/76    Wt Readings from Last 3 Encounters:  06/30/15 260 lb (117.935 kg)  06/08/15 257 lb 4 oz (116.688 kg)  05/27/15 258 lb (117.028 kg)    Physical Exam  Constitutional: She is oriented to person, place, and time. She appears well-developed and well-nourished. No distress.  HENT:  Mouth/Throat: Oropharynx is clear and moist. No oropharyngeal exudate.  Eyes: Conjunctivae are normal. Right eye exhibits no discharge. Left eye exhibits no discharge. No scleral icterus.  Neck: Normal range of motion. Neck supple. No JVD present. No tracheal deviation present. No thyromegaly present.  Cardiovascular: Normal rate, regular rhythm, normal heart sounds and intact distal pulses.  Exam reveals no gallop and no friction rub.   No murmur heard. Pulmonary/Chest: Effort normal and breath sounds normal. No stridor. No respiratory distress. She has no wheezes. She has no rales. She exhibits no tenderness.  Abdominal: Soft. Normal appearance and bowel sounds are normal. She exhibits no distension and no mass. There is no splenomegaly or hepatomegaly. There is no tenderness. There is no rebound, no guarding and no CVA tenderness. No hernia. Hernia confirmed negative in the ventral  area.  Musculoskeletal: Normal range of motion. She exhibits no edema or tenderness.  Lymphadenopathy:    She has no cervical adenopathy.  Neurological: She is oriented to person, place, and time.  Skin: Skin is warm and dry. No rash noted. She is not diaphoretic. No erythema. No pallor.  Vitals reviewed.   Lab  Results  Component Value Date   WBC 8.8 06/30/2015   HGB 15.3* 06/30/2015   HCT 46.0 06/30/2015   PLT 241.0 06/30/2015   GLUCOSE 79 06/30/2015   ALT 17 06/30/2015   AST 16 06/30/2015   NA 140 06/30/2015   K 4.1 06/30/2015   CL 105 06/30/2015   CREATININE 0.84 06/30/2015   BUN 21 06/30/2015   CO2 27 06/30/2015    No results found.  Assessment & Plan:   Cindy Anthony was seen today for flank pain.  Diagnoses and all orders for this visit:  Left flank pain, chronic- she has had left flank pain for 3 months. She has a few white blood cells in her urine. I have asked her to come back in for urine culture and have ordered a CT scan of the abdomen and pelvis to see if she has an abscess or lesion in her left kidney. Orders: -     Comprehensive metabolic panel; Future -     CBC with Differential/Platelet; Future -     Urinalysis, Routine w reflex microscopic (not at [facility]); Future  I have discontinued Cindy Anthony's amLODipine and rifaximin. I am also having her maintain her omeprazole, LORazepam, and naproxen.  No orders of the defined types were placed in this encounter.     Follow-up: Return in about 3 weeks (around 07/21/2015).  Sanda Linger, MD

## 2015-06-30 NOTE — Progress Notes (Signed)
Pre visit review using our clinic review tool, if applicable. No additional management support is needed unless otherwise documented below in the visit note. 

## 2015-06-30 NOTE — Telephone Encounter (Signed)
Pt showed up b/c [facility] never received her sleep study we received from [place] that we received on 06/17/15.  I have faxed this to [facility]. Pt is aware and was very grateful of this. Staff message sent to [facility] letting her know.

## 2015-06-30 NOTE — Patient Instructions (Signed)
Flank Pain °Flank pain refers to pain that is located on the side of the body between the upper abdomen and the back. The pain may occur over a short period of time (acute) or may be long-term or reoccurring (chronic). It may be mild or severe. Flank pain can be caused by many things. °CAUSES  °Some of the more common causes of flank pain include: °· Muscle strains.   °· Muscle spasms.   °· A disease of your spine (vertebral disk disease).   °· A lung infection (pneumonia).   °· Fluid around your lungs (pulmonary edema).   °· A kidney infection.   °· Kidney stones.   °· A very painful skin rash caused by the chickenpox virus (shingles).   °· Gallbladder disease.   °HOME CARE INSTRUCTIONS  °Home care will depend on the cause of your pain. In general, °· Rest as directed by your caregiver. °· Drink enough fluids to keep your urine clear or pale yellow. °· Only take over-the-counter or prescription medicines as directed by your caregiver. Some medicines may help relieve the pain. °· Tell your caregiver about any changes in your pain. °· Follow up with your caregiver as directed. °SEEK IMMEDIATE MEDICAL CARE IF:  °· Your pain is not controlled with medicine.   °· You have new or worsening symptoms. °· Your pain increases.   °· You have abdominal pain.   °· You have shortness of breath.   °· You have persistent nausea or vomiting.   °· You have swelling in your abdomen.   °· You feel faint or pass out.   °· You have blood in your urine. °· You have a fever or persistent symptoms for more than 2-3 days. °· You have a fever and your symptoms suddenly get worse. °MAKE SURE YOU:  °· Understand these instructions. °· Will watch your condition. °· Will get help right away if you are not doing well or get worse. °Document Released: 01/03/2006 Document Revised: 08/06/2012 Document Reviewed: 06/26/2012 °ExitCare® Patient Information ©2015 [employer]. This information is not intended to replace advice given to you by your  health care provider. Make sure you discuss any questions you have with your health care provider. ° °

## 2015-07-01 ENCOUNTER — Telehealth: Payer: Self-pay | Admitting: Internal Medicine

## 2015-07-03 DIAGNOSIS — R8271 Bacteriuria: Secondary | ICD-10-CM | POA: Insufficient documentation

## 2015-07-03 DIAGNOSIS — R8281 Pyuria: Secondary | ICD-10-CM

## 2015-07-04 NOTE — Telephone Encounter (Signed)
-----   Message from Etta Grandchild, MD sent at 07/03/2015 11:29 AM EDT ----- Please let her know that her blood work was normal however there were some white blood cells in her urine. Ask her to come back again and give another urine specimen for urine culture to see if she has an infection in her left kidney. Also I have ordered a CT scan of her abdomen to see if there is a mass or abscess in her left kidney to see what is causing her left flank pain.

## 2015-07-04 NOTE — Telephone Encounter (Signed)
Pt return call back gave her md response.../lmb 

## 2015-07-04 NOTE — Telephone Encounter (Signed)
Left msg on triage requesting test results that was done last week with Dr. [name]. Called pt no answer LMOM RTC...Raechel Chute

## 2015-07-07 ENCOUNTER — Telehealth: Payer: Self-pay | Admitting: *Deleted

## 2015-07-07 NOTE — Telephone Encounter (Signed)
Notified pt with md response. Pt is wanting to have scan done to check hips & buttock area as well...Raechel Chute

## 2015-07-07 NOTE — Telephone Encounter (Signed)
Left msg on triage stating Dr. [name] order a CT scan she is wanting to make sure that the scan will cover her groin area, hips & buttock area. That is where she is having the most pain she stated she fell 2 years ago on her buttock area...Cindy Anthony

## 2015-07-07 NOTE — Telephone Encounter (Signed)
The CT scan has been ordered to look at the organs in her abdomen and pelvis, it's not a great test for musculoskeletal disorders such as the joints and the back

## 2015-07-08 ENCOUNTER — Other Ambulatory Visit: Payer: BC Managed Care – PPO

## 2015-07-08 DIAGNOSIS — G8929 Other chronic pain: Secondary | ICD-10-CM

## 2015-07-08 DIAGNOSIS — R8281 Pyuria: Secondary | ICD-10-CM

## 2015-07-08 DIAGNOSIS — R8271 Bacteriuria: Secondary | ICD-10-CM

## 2015-07-08 DIAGNOSIS — R109 Unspecified abdominal pain: Principal | ICD-10-CM

## 2015-07-08 NOTE — Telephone Encounter (Signed)
Received call this am pt checking status on other scan to check her hips & buttock area. Inform her md hasn't address the msg yet. Pt states she would rather have a test to check those specific area. The pain starts in her groin area then buttock and goes down her (L) leg. Inform pt once md address msg will give her a call back with his response...Raechel Chute

## 2015-07-09 LAB — CULTURE, URINE COMPREHENSIVE: Colony Count: 45000

## 2015-07-09 NOTE — Telephone Encounter (Signed)
She told me that she has seen an orthopedist about this. I recommend that she discuss further testing with her orthopedist.

## 2015-07-11 NOTE — Telephone Encounter (Signed)
Pt informed of MD response below.  

## 2015-07-18 ENCOUNTER — Telehealth: Payer: Self-pay | Admitting: Pulmonary Disease

## 2015-07-18 ENCOUNTER — Telehealth: Payer: Self-pay | Admitting: Internal Medicine

## 2015-07-18 NOTE — Telephone Encounter (Signed)
Called and spoke with patient about her urine culture results. Per Dr. [name] there was not enough infection to treat. There was just a little bit of strep. He said as long as the patient is not having any urinary symptoms, we do not have to send in any medication. I spoke with the patient and she said she feels fine and is not having any symptoms.

## 2015-07-18 NOTE — Telephone Encounter (Signed)
Left patient a message to call me back.

## 2015-07-18 NOTE — Telephone Encounter (Signed)
Called and spoke with Fleet Contras at [facility] - states that the patient is responsible for paying $244 up front - no payment plan available for this. Insurance is paying 80% and she is required to pay 20% And a rental fee of about $75.51 monthly x 10 months - when deductible is hit this amount will decrease to about $15.10 then once 10 months is complete, unit will be owned at this point.  During this 10 month period, all supplies are covered 100% by insurance.  Once PAP unit is owned then financial responsibility for all supplies is then shifted to the patient and the patient will be responsible for paying for any supplies needed.    Pt aware of the information above regarding new PAP device. Pt states that she wishes to hold off a few month until she feels she is more financially stable to be able to afford this equipment. Pt advised to contact [facility] and speak with Fleet Contras - pt states that she did not have a good conversation with the las rep she spoke with.   Will close this encounter as nothing further is needed at this time - pt will let us know when she wishes to proceed.

## 2015-07-18 NOTE — Telephone Encounter (Signed)
Patient returned phone call.  She can be reached at [phone].

## 2015-07-18 NOTE — Telephone Encounter (Signed)
Patient is calling to follow up on 07/08/2015 urine culture. i do not show that it has been read and resulted. Please follow up and call patient back.

## 2015-07-22 ENCOUNTER — Ambulatory Visit (INDEPENDENT_AMBULATORY_CARE_PROVIDER_SITE_OTHER)
Admission: RE | Admit: 2015-07-22 | Discharge: 2015-07-22 | Disposition: A | Discharge status: Home / Self Care | Payer: BC Managed Care – PPO | Source: Ambulatory Visit | Attending: Internal Medicine | Admitting: Internal Medicine

## 2015-07-22 DIAGNOSIS — R1012 Left upper quadrant pain: Secondary | ICD-10-CM | POA: Diagnosis not present

## 2015-07-22 DIAGNOSIS — G8929 Other chronic pain: Secondary | ICD-10-CM | POA: Diagnosis not present

## 2015-07-22 DIAGNOSIS — R8271 Bacteriuria: Secondary | ICD-10-CM

## 2015-07-22 DIAGNOSIS — N390 Urinary tract infection, site not specified: Secondary | ICD-10-CM

## 2015-07-22 DIAGNOSIS — R8281 Pyuria: Secondary | ICD-10-CM

## 2015-07-22 DIAGNOSIS — R109 Unspecified abdominal pain: Principal | ICD-10-CM

## 2015-07-22 MED ORDER — IOHEXOL 300 MG/ML  SOLN
100.0000 mL | Freq: Once | INTRAMUSCULAR | Status: AC | PRN
  Administered 2015-07-22: 100 mL via INTRAVENOUS

## 2015-07-25 ENCOUNTER — Telehealth: Payer: Self-pay | Admitting: Internal Medicine

## 2015-07-25 ENCOUNTER — Other Ambulatory Visit: Payer: Self-pay | Admitting: Internal Medicine

## 2015-07-25 DIAGNOSIS — K551 Chronic vascular disorders of intestine: Secondary | ICD-10-CM

## 2015-07-25 NOTE — Telephone Encounter (Signed)
Patient is call for the  Results of her Cat Scan.

## 2015-07-26 NOTE — Telephone Encounter (Signed)
This is likely okay, if worsening pain in the stomach or bloody stools would need to be seen sooner.

## 2015-07-26 NOTE — Telephone Encounter (Signed)
Patient called back and I let her know what Dr. Dorise Hiss said and she feels maybe she does need to come earlier.  Can you please call her to discuss at [phone]

## 2015-07-26 NOTE — Telephone Encounter (Signed)
Pt called in said that she has an appt with vein and vascular on sept 26th but if she needs to get in sooner then we would need to call?

## 2015-07-26 NOTE — Telephone Encounter (Signed)
Pt informed today. 

## 2015-07-26 NOTE — Telephone Encounter (Signed)
Is this appt ok, or does it need to be sooner?

## 2015-07-27 NOTE — Telephone Encounter (Signed)
Patient ask you to call her concerning her referral, she need an earlier appt, please advise

## 2015-07-27 NOTE — Telephone Encounter (Signed)
Spoke with patient. I had to leave a voice mail with the office's scheduling dept to see if I could get her in sooner. I also tried to reach patient to inform her that I called to have her appt rescheduled to an earlier date. I had to leave her a message.

## 2015-08-02 ENCOUNTER — Encounter: Payer: Self-pay | Admitting: Vascular Surgery

## 2015-08-03 ENCOUNTER — Encounter: Payer: Self-pay | Admitting: Vascular Surgery

## 2015-08-03 ENCOUNTER — Ambulatory Visit (INDEPENDENT_AMBULATORY_CARE_PROVIDER_SITE_OTHER): Payer: BC Managed Care – PPO | Admitting: Vascular Surgery

## 2015-08-03 VITALS — BP 140/86 | HR 74 | Ht 63.0 in | Wt 264.9 lb

## 2015-08-03 DIAGNOSIS — R1032 Left lower quadrant pain: Secondary | ICD-10-CM

## 2015-08-03 NOTE — Progress Notes (Signed)
[facility] [place] HISTORY AND PHYSICAL   History of Present Illness:  Patient is a 64 y.o. year old female who presents for evaluation of mesenteric ischemia. The patient has had chronic intermittent left lower quadrant pain for several weeks. She had a CT scan of the abdomen and pelvis approximate 2 weeks ago. This showed some hyperemia in the left lower quadrant.  She was referred here for further evaluation. She has had some intermittent problems with constipation recently but denies any ongoing abdominal pain. She denies postprandial abdominal pain. She has not had any weight loss. In 2010 she underwent a gastric sleeve resection and had no weight loss from this either. She does have borderline elevated cholesterol. She also has borderline hypertension. She denies history of tobacco abuse or diabetes. She states that the pain can sometimes be in her left groin with radiation up in the left abdomen. She denies nausea or vomiting. Other medical problems include depression, multiple joint arthritis, anxiety, sleep apnea all of which are currently controlled.  Past Medical History  Diagnosis Date  . Arthritis   . Depression   . Hyperlipidemia   . Hypertension   . Anxiety   . Sleep apnea     Past Surgical History  Procedure Laterality Date  . Abdominal hysterectomy    . Total knee arthroplasty Right 2009  . Shoulder arthroscopy Right 2007  . Bariatric surgery  2013    sleeve    Social History Social History  Substance Use Topics  . Smoking status: Never Smoker   . Smokeless tobacco: None  . Alcohol Use: No    Family History Family History  Problem Relation Age of Onset  . Arthritis Mother   . Hyperlipidemia Mother   . Heart disease Mother   . Stroke Mother   . Hypertension Mother   . Kidney disease Mother   . Diabetes Mother   . Colon polyps Mother   . Heart attack Mother   . Arthritis Father   . Heart disease Father   . Diabetes Paternal  Grandmother   . Hyperlipidemia Sister   . Hypertension Brother     Allergies  No Known Allergies   Current Outpatient Prescriptions  Medication Sig Dispense Refill  . LORazepam (ATIVAN) 0.5 MG tablet Take 0.5 mg by mouth 2 (two) times daily.    . naproxen (NAPROSYN) 500 MG tablet Take 500 mg by mouth as needed.    Marland Kitchen omeprazole (PRILOSEC) 40 MG capsule Take 40 mg by mouth daily.     No current facility-administered medications for this visit.    ROS:   General:  No weight loss, Fever, chills  HEENT: No recent headaches, no nasal bleeding, no visual changes, no sore throat  Neurologic: No dizziness, blackouts, seizures. No recent symptoms of stroke or mini- stroke. No recent episodes of slurred speech, or temporary blindness.  Cardiac: No recent episodes of chest pain/pressure, no shortness of breath at rest.  + shortness of breath with exertion.  Denies history of atrial fibrillation or irregular heartbeat  Vascular: No history of rest pain in feet.  No history of claudication.  No history of non-healing ulcer, No history of DVT   Pulmonary: No home oxygen, no productive cough, no hemoptysis,  No asthma or wheezing  Musculoskeletal:  [ x] Arthritis, [ ]  Low back pain,  [x ] Joint pain  Hematologic:No history of hypercoagulable state.  No history of easy bleeding.  No history of anemia  Gastrointestinal: No hematochezia  or melena,  No gastroesophageal reflux, no trouble swallowing  Urinary:  chronic Kidney disease,  on HD -  MWF or  TTHS,  Burning with urination,  Frequent urination,  Difficulty urinating;   Skin: No rashes  Psychological: + history of anxiety,  + history of depression   Physical Examination  Filed Vitals:   08/03/15 1356  BP: 140/86  Pulse: 74  Height:  (1.6 m)  Weight: 264 lb 14.4 oz (120.158 kg)  SpO2: 98%    Body mass index is 46.94 kg/(m^2).  General:  Alert and oriented, no acute distress HEENT: Normal Neck:  No bruit or JVD Pulmonary: Clear to auscultation bilaterally Cardiac: Regular Rate and Rhythm without murmur Abdomen: Soft, non-tender, non-distended, no mass, obese Skin: No rash Extremity Pulses:  2+ radial, brachial, femoral pulses bilaterally Musculoskeletal: No deformity or edema  Neurologic: Upper and lower extremity motor 5/5 and symmetric  DATA:  CT scan of the abdomen and pelvis with oral and IV contrast is reviewed. The area of hyperemia in the left lower quadrant was visualized. No obvious etiology for this. There is no focal stenosis of the superior mesenteric inferior mesenteric or celiac artery.   ASSESSMENT:  Chronic abdominal pain with no evidence of mesenteric arterial occlusive disease with widely patent superior mesenteric inferior mesenteric and celiac arteries   PLAN:  Patient will follow-up on an as-needed basis.  Fabienne Bruns, MD [facility] [place] Office: [phone] Pager: [phone]

## 2015-08-22 ENCOUNTER — Ambulatory Visit: Payer: BC Managed Care – PPO | Admitting: Adult Health

## 2015-08-22 ENCOUNTER — Encounter: Payer: BC Managed Care – PPO | Admitting: Surgery

## 2015-08-26 ENCOUNTER — Encounter: Payer: Self-pay | Admitting: Adult Health

## 2015-08-26 ENCOUNTER — Ambulatory Visit (INDEPENDENT_AMBULATORY_CARE_PROVIDER_SITE_OTHER): Payer: BC Managed Care – PPO | Admitting: Adult Health

## 2015-08-26 VITALS — BP 128/72 | HR 53 | Temp 98.3°F | Ht 64.0 in | Wt 266.0 lb

## 2015-08-26 DIAGNOSIS — Z23 Encounter for immunization: Secondary | ICD-10-CM

## 2015-08-26 DIAGNOSIS — G4733 Obstructive sleep apnea (adult) (pediatric): Secondary | ICD-10-CM

## 2015-08-26 NOTE — Patient Instructions (Signed)
Continue on CPAP At bedtime   Goal is to wear for at least 6 hr .  Work on weight loss.  Take CPAP chip to DME for Download  Flu shot today .  follow up Dr. Vassie Loll  In 6 months and As needed

## 2015-08-26 NOTE — Progress Notes (Signed)
   Subjective:    Patient ID: Cindy Anthony, female    DOB: 03/28/1951, 64 y.o.   MRN: [id number]  HPI 64 year old female with obstructive sleep apnea on nocturnal C Pap. She underwent gastric bypass in 2010.  08/26/2015 follow-up for sleep apnea Patient returns for a follow-up for sleep apnea. Patient has an old C Pap machine and was trying to get a new one. However, was unable to afford a new one.  She feels that her old machine is doing okay for now. She does have a chip and requested a download. Says that she has been compliant with her machine. Denies significant daytime sleepiness.   Review of Systems Constitutional:   No  weight loss, night sweats,  Fevers, chills,  +fatigue, or  lassitude.  HEENT:   No headaches,  Difficulty swallowing,  Tooth/dental problems, or  Sore throat,                No sneezing, itching, ear ache, nasal congestion, post nasal drip,   CV:  No chest pain,  Orthopnea, PND, swelling in lower extremities, anasarca, dizziness, palpitations, syncope.   GI  No heartburn, indigestion, abdominal pain, nausea, vomiting, diarrhea, change in bowel habits, loss of appetite, bloody stools.   Resp: No shortness of breath with exertion or at rest.  No excess mucus, no productive cough,  No non-productive cough,  No coughing up of blood.  No change in color of mucus.  No wheezing.  No chest wall deformity  Skin: no rash or lesions.  GU: no dysuria, change in color of urine, no urgency or frequency.  No flank pain, no hematuria   MS:  No joint pain or swelling.  No decreased range of motion.  No back pain.  Psych:  No change in mood or affect. No depression or anxiety.  No memory loss.         Objective:   Physical Exam  GEN: A/Ox3; pleasant , NAD, obese    HEENT:  /AT,  EACs-clear, TMs-wnl, NOSE-clear, THROAT-clear, no lesions, no postnasal drip or exudate noted.   NECK:  Supple w/ fair ROM; no JVD; normal carotid impulses w/o bruits; no thyromegaly  or nodules palpated; no lymphadenopathy.  RESP  Clear  P & A; w/o, wheezes/ rales/ or rhonchi.no accessory muscle use, no dullness to percussion  CARD:  RRR, no m/r/g  , no peripheral edema, pulses intact, no cyanosis or clubbing.  GI:   Soft & nt; nml bowel sounds; no organomegaly or masses detected.  Musco: Warm bil, no deformities or joint swelling noted.   Neuro: alert, no focal deficits noted.    Skin: Warm, no lesions or rashes        Assessment & Plan:

## 2015-08-30 NOTE — Assessment & Plan Note (Signed)
Weight loss encouraged 

## 2015-08-30 NOTE — Assessment & Plan Note (Signed)
Download requested  Plan  Continue on CPAP At bedtime   Goal is to wear for at least 6 hr .  Work on weight loss.  Take CPAP chip to DME for Download  Flu shot today .  follow up Dr. Vassie Loll  In 6 months and As needed

## 2015-08-31 ENCOUNTER — Telehealth: Payer: Self-pay | Admitting: Internal Medicine

## 2015-08-31 NOTE — Telephone Encounter (Signed)
Received records from [facility]. Sent to Dr. Dorise Hiss. 08/31/15/ss

## 2015-09-02 ENCOUNTER — Ambulatory Visit: Payer: BC Managed Care – PPO | Admitting: Internal Medicine

## 2015-10-31 ENCOUNTER — Telehealth: Payer: Self-pay | Admitting: Pulmonary Disease

## 2015-10-31 NOTE — Telephone Encounter (Signed)
Download as of 09/2014  F & P CPAP 7 cm,good usage > 6h avg Let us know if she has any problems

## 2015-11-01 NOTE — Telephone Encounter (Signed)
Left message for patient to call back  

## 2015-11-01 NOTE — Telephone Encounter (Signed)
Results have been explained to patient, pt expressed understanding. Nothing further needed.  

## 2015-11-01 NOTE — Telephone Encounter (Signed)
Pt returning call and can be reached @ home.Marland Kitchen.Marland Kitchen.Caren GriffinsStanley A Dalton

## 2015-11-08 ENCOUNTER — Encounter: Payer: Self-pay | Admitting: Pulmonary Disease

## 2016-01-02 DIAGNOSIS — M5442 Lumbago with sciatica, left side: Secondary | ICD-10-CM | POA: Diagnosis not present

## 2016-01-02 DIAGNOSIS — M5441 Lumbago with sciatica, right side: Secondary | ICD-10-CM | POA: Diagnosis not present

## 2016-01-02 DIAGNOSIS — M5136 Other intervertebral disc degeneration, lumbar region: Secondary | ICD-10-CM | POA: Diagnosis not present

## 2016-01-02 DIAGNOSIS — G8929 Other chronic pain: Secondary | ICD-10-CM | POA: Diagnosis not present

## 2016-01-04 DIAGNOSIS — M545 Low back pain: Secondary | ICD-10-CM | POA: Diagnosis not present

## 2016-01-11 DIAGNOSIS — M545 Low back pain: Secondary | ICD-10-CM | POA: Diagnosis not present

## 2016-01-13 DIAGNOSIS — M545 Low back pain: Secondary | ICD-10-CM | POA: Diagnosis not present

## 2016-01-18 DIAGNOSIS — M545 Low back pain: Secondary | ICD-10-CM | POA: Diagnosis not present

## 2016-01-20 DIAGNOSIS — M545 Low back pain: Secondary | ICD-10-CM | POA: Diagnosis not present

## 2016-01-25 DIAGNOSIS — M25562 Pain in left knee: Secondary | ICD-10-CM | POA: Diagnosis not present

## 2016-01-25 DIAGNOSIS — M1712 Unilateral primary osteoarthritis, left knee: Secondary | ICD-10-CM | POA: Diagnosis not present

## 2016-01-25 DIAGNOSIS — M545 Low back pain: Secondary | ICD-10-CM | POA: Diagnosis not present

## 2016-01-27 DIAGNOSIS — M545 Low back pain: Secondary | ICD-10-CM | POA: Diagnosis not present

## 2016-01-30 DIAGNOSIS — G8929 Other chronic pain: Secondary | ICD-10-CM | POA: Diagnosis not present

## 2016-01-30 DIAGNOSIS — M5442 Lumbago with sciatica, left side: Secondary | ICD-10-CM | POA: Diagnosis not present

## 2016-01-30 DIAGNOSIS — M5136 Other intervertebral disc degeneration, lumbar region: Secondary | ICD-10-CM | POA: Diagnosis not present

## 2016-01-30 DIAGNOSIS — M5441 Lumbago with sciatica, right side: Secondary | ICD-10-CM | POA: Diagnosis not present

## 2016-02-01 DIAGNOSIS — M25562 Pain in left knee: Secondary | ICD-10-CM | POA: Diagnosis not present

## 2016-02-01 DIAGNOSIS — M25561 Pain in right knee: Secondary | ICD-10-CM | POA: Diagnosis not present

## 2016-02-01 DIAGNOSIS — M179 Osteoarthritis of knee, unspecified: Secondary | ICD-10-CM | POA: Diagnosis not present

## 2016-02-01 DIAGNOSIS — Z96651 Presence of right artificial knee joint: Secondary | ICD-10-CM | POA: Diagnosis not present

## 2016-02-01 DIAGNOSIS — M1712 Unilateral primary osteoarthritis, left knee: Secondary | ICD-10-CM | POA: Diagnosis not present

## 2016-02-06 DIAGNOSIS — M25562 Pain in left knee: Secondary | ICD-10-CM | POA: Diagnosis not present

## 2016-02-06 DIAGNOSIS — M1712 Unilateral primary osteoarthritis, left knee: Secondary | ICD-10-CM | POA: Diagnosis not present

## 2016-02-15 DIAGNOSIS — M25562 Pain in left knee: Secondary | ICD-10-CM | POA: Diagnosis not present

## 2016-02-15 DIAGNOSIS — M25511 Pain in right shoulder: Secondary | ICD-10-CM | POA: Diagnosis not present

## 2016-02-15 DIAGNOSIS — M1712 Unilateral primary osteoarthritis, left knee: Secondary | ICD-10-CM | POA: Diagnosis not present

## 2016-02-15 DIAGNOSIS — M5441 Lumbago with sciatica, right side: Secondary | ICD-10-CM | POA: Diagnosis not present

## 2016-02-15 DIAGNOSIS — G8929 Other chronic pain: Secondary | ICD-10-CM | POA: Diagnosis not present

## 2016-02-15 DIAGNOSIS — M5442 Lumbago with sciatica, left side: Secondary | ICD-10-CM | POA: Diagnosis not present

## 2016-02-15 DIAGNOSIS — M5136 Other intervertebral disc degeneration, lumbar region: Secondary | ICD-10-CM | POA: Diagnosis not present

## 2016-02-22 DIAGNOSIS — M25562 Pain in left knee: Secondary | ICD-10-CM | POA: Diagnosis not present

## 2016-02-22 DIAGNOSIS — M1712 Unilateral primary osteoarthritis, left knee: Secondary | ICD-10-CM | POA: Diagnosis not present

## 2016-02-24 DIAGNOSIS — G8929 Other chronic pain: Secondary | ICD-10-CM | POA: Diagnosis not present

## 2016-02-24 DIAGNOSIS — M5441 Lumbago with sciatica, right side: Secondary | ICD-10-CM | POA: Diagnosis not present

## 2016-02-24 DIAGNOSIS — M25511 Pain in right shoulder: Secondary | ICD-10-CM | POA: Diagnosis not present

## 2016-02-24 DIAGNOSIS — M5442 Lumbago with sciatica, left side: Secondary | ICD-10-CM | POA: Diagnosis not present

## 2016-02-24 DIAGNOSIS — M19011 Primary osteoarthritis, right shoulder: Secondary | ICD-10-CM | POA: Diagnosis not present

## 2016-02-24 DIAGNOSIS — M75101 Unspecified rotator cuff tear or rupture of right shoulder, not specified as traumatic: Secondary | ICD-10-CM | POA: Diagnosis not present

## 2016-02-24 DIAGNOSIS — M5416 Radiculopathy, lumbar region: Secondary | ICD-10-CM | POA: Diagnosis not present

## 2016-02-29 DIAGNOSIS — M25562 Pain in left knee: Secondary | ICD-10-CM | POA: Diagnosis not present

## 2016-02-29 DIAGNOSIS — M1712 Unilateral primary osteoarthritis, left knee: Secondary | ICD-10-CM | POA: Diagnosis not present

## 2016-03-05 ENCOUNTER — Ambulatory Visit: Payer: BC Managed Care – PPO | Admitting: Pulmonary Disease

## 2016-04-27 DIAGNOSIS — G5701 Lesion of sciatic nerve, right lower limb: Secondary | ICD-10-CM | POA: Diagnosis not present

## 2016-04-30 ENCOUNTER — Ambulatory Visit (INDEPENDENT_AMBULATORY_CARE_PROVIDER_SITE_OTHER): Payer: Medicare Other | Admitting: Pulmonary Disease

## 2016-04-30 ENCOUNTER — Encounter: Payer: Self-pay | Admitting: Pulmonary Disease

## 2016-04-30 DIAGNOSIS — G4733 Obstructive sleep apnea (adult) (pediatric): Secondary | ICD-10-CM | POA: Diagnosis not present

## 2016-04-30 NOTE — Patient Instructions (Signed)
  Prescription will be sent for CPAP 7 cm with small nasal mask You may need a home sleep study to qualify  Referral to nutritionist to review diet

## 2016-04-30 NOTE — Assessment & Plan Note (Signed)
Controlled.  

## 2016-04-30 NOTE — Progress Notes (Signed)
   Subjective:    Patient ID: Cindy Anthony, female    DOB: 04/03/1951, 65 y.o.   MRN: [id number]  HPI  65 year old  for FU of  obstructive sleep apnea. [place]  [place], works as a Education officer, environmentalcollege instructor. She was diagnosed with OSA in 2008, maintained on CPAP 7 cm with a small nasal mask.   04/30/2016  Chief Complaint  Patient presents with  . Follow-up    pt states she wears CPAP avg 7hr nighly. feels mask & pressure are ok. occ waking up not feeling well rested. WUJ:[id number]     She has a [employer] paykel machine,which has gotten old, DME was [facility]-she would like to change.  No issues with mask or pressure She wakes up feeling rested no snoring or apneas have been witnessed   She underwent gastric bypass in 2010 and lost about 15 pounds, her peak weight was 288 she is now back upto 273 lbs  Significant tests/ events  02/2009 CPAP titration >> 7 cm  Review of Systems Patient denies significant dyspnea,cough, hemoptysis,  chest pain, palpitations, pedal edema, orthopnea, paroxysmal nocturnal dyspnea, lightheadedness, nausea, vomiting, abdominal or  leg pains      Objective:   Physical Exam   Gen. Pleasant, obese, in no distress ENT - no lesions, no post nasal drip Neck: No JVD, no thyromegaly, no carotid bruits Lungs: no use of accessory muscles, no dullness to percussion, decreased without rales or rhonchi  Cardiovascular: Rhythm regular, heart sounds  normal, no murmurs or gallops, no peripheral edema Musculoskeletal: No deformities, no cyanosis or clubbing , no tremors        Assessment & Plan:

## 2016-04-30 NOTE — Assessment & Plan Note (Signed)
Prescription will be sent for CPAP 7 cm with small nasal mask You may need a home sleep study to qualify  Referral to nutritionist to review diet  Weight loss encouraged, compliance with goal of at least 4-6 hrs every night is the expectation. Advised against medications with sedative side effects Cautioned against driving when sleepy - understanding that sleepiness will vary on a day to day basis

## 2016-05-11 DIAGNOSIS — Z1211 Encounter for screening for malignant neoplasm of colon: Secondary | ICD-10-CM | POA: Diagnosis not present

## 2016-05-11 DIAGNOSIS — Z01419 Encounter for gynecological examination (general) (routine) without abnormal findings: Secondary | ICD-10-CM | POA: Diagnosis not present

## 2016-05-24 DIAGNOSIS — Z1231 Encounter for screening mammogram for malignant neoplasm of breast: Secondary | ICD-10-CM | POA: Diagnosis not present

## 2016-06-04 DIAGNOSIS — R2689 Other abnormalities of gait and mobility: Secondary | ICD-10-CM | POA: Diagnosis not present

## 2016-06-04 DIAGNOSIS — M1712 Unilateral primary osteoarthritis, left knee: Secondary | ICD-10-CM | POA: Diagnosis not present

## 2016-06-04 DIAGNOSIS — M25562 Pain in left knee: Secondary | ICD-10-CM | POA: Diagnosis not present

## 2016-06-10 DIAGNOSIS — J209 Acute bronchitis, unspecified: Secondary | ICD-10-CM | POA: Diagnosis not present

## 2016-06-18 ENCOUNTER — Telehealth: Payer: Self-pay | Admitting: Pulmonary Disease

## 2016-06-18 DIAGNOSIS — G4733 Obstructive sleep apnea (adult) (pediatric): Secondary | ICD-10-CM

## 2016-06-18 NOTE — Telephone Encounter (Signed)
New CPAP 7 cm

## 2016-06-18 NOTE — Telephone Encounter (Signed)
Called spoke with pt. She reports she spoke with [facility]. Was advised the Rx we sent over did not state to get her a new CPAP machine, just for supplies. She is requesting this to be done. Please advise Dr. Vassie Loll if okay to place order for new CPAP. thanks

## 2016-06-19 NOTE — Telephone Encounter (Signed)
Order has been placed for the pt and the pt is aware.

## 2016-06-20 DIAGNOSIS — I10 Essential (primary) hypertension: Secondary | ICD-10-CM | POA: Diagnosis not present

## 2016-06-20 DIAGNOSIS — E785 Hyperlipidemia, unspecified: Secondary | ICD-10-CM | POA: Diagnosis not present

## 2016-06-20 DIAGNOSIS — R5381 Other malaise: Secondary | ICD-10-CM | POA: Diagnosis not present

## 2016-06-20 DIAGNOSIS — Z7689 Persons encountering health services in other specified circumstances: Secondary | ICD-10-CM | POA: Diagnosis not present

## 2016-06-20 DIAGNOSIS — R5383 Other fatigue: Secondary | ICD-10-CM | POA: Diagnosis not present

## 2016-06-20 DIAGNOSIS — Z0000 Encounter for general adult medical examination without abnormal findings: Secondary | ICD-10-CM | POA: Diagnosis not present

## 2016-06-22 DIAGNOSIS — Z0000 Encounter for general adult medical examination without abnormal findings: Secondary | ICD-10-CM | POA: Diagnosis not present

## 2016-06-22 DIAGNOSIS — R5383 Other fatigue: Secondary | ICD-10-CM | POA: Diagnosis not present

## 2016-06-22 DIAGNOSIS — R5381 Other malaise: Secondary | ICD-10-CM | POA: Diagnosis not present

## 2016-06-28 DIAGNOSIS — T84022A Instability of internal right knee prosthesis, initial encounter: Secondary | ICD-10-CM | POA: Diagnosis not present

## 2016-06-28 DIAGNOSIS — Z96651 Presence of right artificial knee joint: Secondary | ICD-10-CM | POA: Diagnosis not present

## 2016-06-28 DIAGNOSIS — G8929 Other chronic pain: Secondary | ICD-10-CM | POA: Diagnosis not present

## 2016-06-28 DIAGNOSIS — M25561 Pain in right knee: Secondary | ICD-10-CM | POA: Diagnosis not present

## 2016-07-24 ENCOUNTER — Telehealth: Payer: Self-pay | Admitting: Pulmonary Disease

## 2016-07-24 NOTE — Telephone Encounter (Signed)
Called patient and she reports [facility] and needed nothing further

## 2016-07-24 NOTE — Telephone Encounter (Signed)
Pt returning call.Cindy Anthony ° °

## 2016-07-24 NOTE — Telephone Encounter (Signed)
lmtcb for pt to see which [facility] she uses then we will refax Cindy Anthony

## 2016-07-26 ENCOUNTER — Telehealth: Payer: Self-pay | Admitting: Pulmonary Disease

## 2016-07-26 NOTE — Telephone Encounter (Signed)
[facility] She states that the order we placed on 06/19/16 was refaxed to them on 07/24/16. This order needs to have the certain supplies and humidifer listed on this order so that they can supply the pt. I offered to place a brand new order and Angel declined. She states that she fax an order form to [phone] to RA sign. This form will have all the pertinent information they need on it. Will hold in triage until this form is received.

## 2016-07-27 DIAGNOSIS — F411 Generalized anxiety disorder: Secondary | ICD-10-CM | POA: Diagnosis not present

## 2016-07-27 DIAGNOSIS — J3489 Other specified disorders of nose and nasal sinuses: Secondary | ICD-10-CM | POA: Diagnosis not present

## 2016-07-27 DIAGNOSIS — Z6841 Body Mass Index (BMI) 40.0 and over, adult: Secondary | ICD-10-CM | POA: Diagnosis not present

## 2016-07-27 DIAGNOSIS — I10 Essential (primary) hypertension: Secondary | ICD-10-CM | POA: Diagnosis not present

## 2016-07-27 NOTE — Telephone Encounter (Signed)
Will forward to [place] to follow up on forms once signed by RA.

## 2016-07-27 NOTE — Telephone Encounter (Signed)
Yes it came in yesterday and it is in Dr. Reginia NaasAlva's CMN folder for him to sign when he returns to office

## 2016-07-27 NOTE — Telephone Encounter (Signed)
Synetta Failnita have you received a supply form for this pts cpap supplies?  thanks

## 2016-08-06 DIAGNOSIS — I10 Essential (primary) hypertension: Secondary | ICD-10-CM | POA: Diagnosis not present

## 2016-08-07 NOTE — Telephone Encounter (Signed)
Please advise Marcelino DusterMichelle if this has been taken care of. Thanks.

## 2016-08-08 NOTE — Telephone Encounter (Signed)
CMN folder was returned to [place]. Synetta Failnita - please advise if this has been done. Thanks.

## 2016-08-09 NOTE — Telephone Encounter (Signed)
Sorry this was signed on 08/07/2016 and faxed [facility] on 08/08/2016 received confirmation from the fax that it was received

## 2016-08-10 DIAGNOSIS — R921 Mammographic calcification found on diagnostic imaging of breast: Secondary | ICD-10-CM | POA: Diagnosis not present

## 2016-08-10 DIAGNOSIS — R928 Other abnormal and inconclusive findings on diagnostic imaging of breast: Secondary | ICD-10-CM | POA: Diagnosis not present

## 2016-08-15 ENCOUNTER — Telehealth: Payer: Self-pay | Admitting: Pulmonary Disease

## 2016-08-15 NOTE — Telephone Encounter (Signed)
Attempted to call Law[facility]usAngel [facility] No answer, option to leave her a message. Will try back.

## 2016-08-16 NOTE — Telephone Encounter (Signed)
[facility] to [facility], but the office is closed at this time.  Will try back later.

## 2016-08-16 NOTE — Telephone Encounter (Signed)
Spoke with pt, states that [facility] is needing signed OV notes in order to process her CPAP order from July.   Called [facility] and spoke to [place], states that they've received unsigned OV notes but need signed ov notes.   Most recent OV note has been printed, signed, and faxed back with e-signature starred for easy visibility.   Nothing further needed.

## 2016-08-24 DIAGNOSIS — I10 Essential (primary) hypertension: Secondary | ICD-10-CM | POA: Diagnosis not present

## 2016-08-24 DIAGNOSIS — Z23 Encounter for immunization: Secondary | ICD-10-CM | POA: Diagnosis not present

## 2016-09-04 DIAGNOSIS — G5701 Lesion of sciatic nerve, right lower limb: Secondary | ICD-10-CM | POA: Diagnosis not present

## 2016-09-04 DIAGNOSIS — G5702 Lesion of sciatic nerve, left lower limb: Secondary | ICD-10-CM | POA: Diagnosis not present

## 2016-09-09 DIAGNOSIS — R42 Dizziness and giddiness: Secondary | ICD-10-CM | POA: Diagnosis not present

## 2016-09-11 DIAGNOSIS — G5702 Lesion of sciatic nerve, left lower limb: Secondary | ICD-10-CM | POA: Diagnosis not present

## 2016-09-11 DIAGNOSIS — G5701 Lesion of sciatic nerve, right lower limb: Secondary | ICD-10-CM | POA: Diagnosis not present

## 2016-09-13 DIAGNOSIS — R5383 Other fatigue: Secondary | ICD-10-CM | POA: Diagnosis not present

## 2016-09-13 DIAGNOSIS — R42 Dizziness and giddiness: Secondary | ICD-10-CM | POA: Diagnosis not present

## 2016-09-17 DIAGNOSIS — R42 Dizziness and giddiness: Secondary | ICD-10-CM | POA: Diagnosis not present

## 2016-09-25 DIAGNOSIS — M5416 Radiculopathy, lumbar region: Secondary | ICD-10-CM | POA: Diagnosis not present

## 2016-09-25 DIAGNOSIS — G5701 Lesion of sciatic nerve, right lower limb: Secondary | ICD-10-CM | POA: Diagnosis not present

## 2016-09-25 DIAGNOSIS — G5702 Lesion of sciatic nerve, left lower limb: Secondary | ICD-10-CM | POA: Diagnosis not present

## 2016-09-27 DIAGNOSIS — M1712 Unilateral primary osteoarthritis, left knee: Secondary | ICD-10-CM | POA: Diagnosis not present

## 2016-09-27 DIAGNOSIS — M25562 Pain in left knee: Secondary | ICD-10-CM | POA: Diagnosis not present

## 2016-10-21 DIAGNOSIS — J029 Acute pharyngitis, unspecified: Secondary | ICD-10-CM | POA: Diagnosis not present

## 2016-11-02 ENCOUNTER — Encounter: Payer: Self-pay | Admitting: Adult Health

## 2016-11-02 ENCOUNTER — Ambulatory Visit (INDEPENDENT_AMBULATORY_CARE_PROVIDER_SITE_OTHER): Payer: Medicare Other | Admitting: Adult Health

## 2016-11-02 DIAGNOSIS — G4733 Obstructive sleep apnea (adult) (pediatric): Secondary | ICD-10-CM

## 2016-11-02 NOTE — Assessment & Plan Note (Signed)
Doing well with good control on CPAP   Plan  Patient Instructions  Continue on CPAP At bedtime   Goal is to wear for at least 6 hr .  Work on weight loss.  Do not drive if sleepy .  Follow up Dr. Vassie LollAlva  In 6 months and As needed

## 2016-11-02 NOTE — Progress Notes (Signed)
Subjective:    Patient ID: Cindy Anthony, female    DOB: 07/10/1951, 65 y.o.   MRN: [id number]  HPI 65 year old female with obstructive sleep apnea on nocturnal C Pap. She underwent gastric bypass in 2010.  11/02/2016 Follow-up for sleep apnea Patient returns for a follow-up for sleep apnea on CPAP .  She feels she is doing well. Walking , trying to be healthy.  Download shows excellent compliance with 8hr usage on set pressure at 7cmH20. AHI 1.3 , + leaks.  Denies significant daytime sleepiness.   Past Medical History:  Diagnosis Date  . Anxiety   . Arthritis   . Depression   . Hyperlipidemia   . Hypertension   . Sleep apnea    Current Outpatient Prescriptions on File Prior to Visit  Medication Sig Dispense Refill  . LORazepam (ATIVAN) 0.5 MG tablet Take 0.5 mg by mouth 2 (two) times daily as needed.     Marland Kitchen. omeprazole (PRILOSEC) 40 MG capsule Take 40 mg by mouth daily as needed.      No current facility-administered medications on file prior to visit.        Review of Systems Constitutional:   No  weight loss, night sweats,  Fevers, chills,  +fatigue, or  lassitude.  HEENT:   No headaches,  Difficulty swallowing,  Tooth/dental problems, or  Sore throat,                No sneezing, itching, ear ache, nasal congestion, post nasal drip,   CV:  No chest pain,  Orthopnea, PND, swelling in lower extremities, anasarca, dizziness, palpitations, syncope.   GI  No heartburn, indigestion, abdominal pain, nausea, vomiting, diarrhea, change in bowel habits, loss of appetite, bloody stools.   Resp: No shortness of breath with exertion or at rest.  No excess mucus, no productive cough,  No non-productive cough,  No coughing up of blood.  No change in color of mucus.  No wheezing.  No chest wall deformity  Skin: no rash or lesions.  GU: no dysuria, change in color of urine, no urgency or frequency.  No flank pain, no hematuria   MS:  No joint pain or swelling.  No decreased range  of motion.  No back pain.  Psych:  No change in mood or affect. No depression or anxiety.  No memory loss.         Objective:   Physical Exam Vitals:   11/02/16 1100  BP: 140/78  Pulse: (!) 54  Temp: 97.6 F (36.4 C)  TempSrc: Oral  SpO2: 97%  Weight: 277 lb 9.6 oz (125.9 kg)  Height: 5\' 4"  (1.626 m)    GEN: A/Ox3; pleasant , NAD, obese     HEENT:  [place]/AT,  EACs-clear, TMs-wnl, NOSE-clear, THROAT-clear, no lesions, no postnasal drip or exudate noted.   NECK:  Supple w/ fair ROM; no JVD; normal carotid impulses w/o bruits; no thyromegaly or nodules palpated; no lymphadenopathy.    RESP  Clear  P & A; w/o, wheezes/ rales/ or rhonchi. no accessory muscle use, no dullness to percussion  CARD:  RRR, no m/r/g  , no peripheral edema, pulses intact, no cyanosis or clubbing.  GI:   Soft & nt; nml bowel sounds; no organomegaly or masses detected.   Musco: Warm bil, no deformities or joint swelling noted.   Neuro: alert, no focal deficits noted.    Skin: Warm, no lesions or rashes   Tammy Parrett NP-C   Pulmonary and Critical Care  11/02/2016  

## 2016-11-02 NOTE — Patient Instructions (Signed)
Continue on CPAP At bedtime   Goal is to wear for at least 6 hr .  Work on weight loss.  Do not drive if sleepy .  Follow up Dr. Vassie LollAlva  In 6 months and As needed

## 2016-11-07 DIAGNOSIS — M79662 Pain in left lower leg: Secondary | ICD-10-CM | POA: Diagnosis not present

## 2016-11-08 ENCOUNTER — Encounter: Payer: Self-pay | Admitting: Adult Health

## 2016-11-29 DIAGNOSIS — M5116 Intervertebral disc disorders with radiculopathy, lumbar region: Secondary | ICD-10-CM | POA: Diagnosis not present

## 2016-12-01 NOTE — Progress Notes (Signed)
Reviewed & agree with plan  

## 2016-12-28 DIAGNOSIS — M549 Dorsalgia, unspecified: Secondary | ICD-10-CM | POA: Diagnosis not present

## 2016-12-28 DIAGNOSIS — M5136 Other intervertebral disc degeneration, lumbar region: Secondary | ICD-10-CM | POA: Diagnosis not present

## 2016-12-28 DIAGNOSIS — M545 Low back pain: Secondary | ICD-10-CM | POA: Diagnosis not present

## 2016-12-28 DIAGNOSIS — M25511 Pain in right shoulder: Secondary | ICD-10-CM | POA: Diagnosis not present

## 2016-12-28 DIAGNOSIS — M542 Cervicalgia: Secondary | ICD-10-CM | POA: Diagnosis not present

## 2016-12-28 DIAGNOSIS — M25551 Pain in right hip: Secondary | ICD-10-CM | POA: Diagnosis not present

## 2016-12-28 DIAGNOSIS — M7061 Trochanteric bursitis, right hip: Secondary | ICD-10-CM | POA: Diagnosis not present

## 2017-01-02 ENCOUNTER — Other Ambulatory Visit: Payer: Self-pay | Admitting: Orthopaedic Surgery

## 2017-01-02 DIAGNOSIS — M5136 Other intervertebral disc degeneration, lumbar region: Secondary | ICD-10-CM

## 2017-01-04 DIAGNOSIS — I10 Essential (primary) hypertension: Secondary | ICD-10-CM | POA: Diagnosis not present

## 2017-01-04 DIAGNOSIS — J069 Acute upper respiratory infection, unspecified: Secondary | ICD-10-CM | POA: Diagnosis not present

## 2017-01-08 DIAGNOSIS — J069 Acute upper respiratory infection, unspecified: Secondary | ICD-10-CM | POA: Diagnosis not present

## 2017-01-08 DIAGNOSIS — J208 Acute bronchitis due to other specified organisms: Secondary | ICD-10-CM | POA: Diagnosis not present

## 2017-01-11 ENCOUNTER — Emergency Department
Admission: EM | Admit: 2017-01-11 | Discharge: 2017-01-11 | Disposition: A | Discharge status: Home / Self Care | Payer: Medicare Other | Attending: Emergency Medicine | Admitting: Emergency Medicine

## 2017-01-11 ENCOUNTER — Emergency Department: Payer: Medicare Other

## 2017-01-11 ENCOUNTER — Encounter: Payer: Self-pay | Admitting: Emergency Medicine

## 2017-01-11 ENCOUNTER — Ambulatory Visit
Admission: RE | Admit: 2017-01-11 | Discharge: 2017-01-11 | Disposition: A | Discharge status: Home / Self Care | Payer: Medicare Other | Source: Ambulatory Visit | Attending: Orthopaedic Surgery | Admitting: Orthopaedic Surgery

## 2017-01-11 DIAGNOSIS — I10 Essential (primary) hypertension: Secondary | ICD-10-CM | POA: Diagnosis not present

## 2017-01-11 DIAGNOSIS — R030 Elevated blood-pressure reading, without diagnosis of hypertension: Secondary | ICD-10-CM

## 2017-01-11 DIAGNOSIS — M5126 Other intervertebral disc displacement, lumbar region: Secondary | ICD-10-CM | POA: Diagnosis not present

## 2017-01-11 DIAGNOSIS — Z79899 Other long term (current) drug therapy: Secondary | ICD-10-CM | POA: Insufficient documentation

## 2017-01-11 DIAGNOSIS — R05 Cough: Secondary | ICD-10-CM | POA: Diagnosis not present

## 2017-01-11 DIAGNOSIS — M5136 Other intervertebral disc degeneration, lumbar region: Secondary | ICD-10-CM

## 2017-01-11 DIAGNOSIS — J40 Bronchitis, not specified as acute or chronic: Secondary | ICD-10-CM | POA: Diagnosis not present

## 2017-01-11 LAB — BASIC METABOLIC PANEL
Anion gap: 8 (ref 5–15)
BUN: 19 mg/dL (ref 6–20)
CALCIUM: 9.3 mg/dL (ref 8.9–10.3)
CO2: 25 mmol/L (ref 22–32)
CREATININE: 0.99 mg/dL (ref 0.44–1.00)
Chloride: 106 mmol/L (ref 101–111)
GFR calc non Af Amer: 58 mL/min — ABNORMAL LOW (ref >60)
GLUCOSE: 96 mg/dL (ref 65–99)
Potassium: 3.7 mmol/L (ref 3.5–5.1)
Sodium: 139 mmol/L (ref 135–145)

## 2017-01-11 LAB — CBC WITH DIFFERENTIAL/PLATELET
Basophils Absolute: 0 10*3/uL (ref 0–0.1)
Basophils Relative: 1 %
Eosinophils Absolute: 0 10*3/uL (ref 0–0.7)
Eosinophils Relative: 0 %
HCT: 45.6 % (ref 35.0–47.0)
Hemoglobin: 15.2 g/dL (ref 12.0–16.0)
Lymphocytes Relative: 22 %
Lymphs Abs: 1.7 10*3/uL (ref 1.0–3.6)
MCH: 30.3 pg (ref 26.0–34.0)
MCHC: 33.4 g/dL (ref 32.0–36.0)
MCV: 90.7 fL (ref 80.0–100.0)
MONO ABS: 0.6 10*3/uL (ref 0.2–0.9)
MONOS PCT: 8 %
NEUTROS ABS: 5.2 10*3/uL (ref 1.4–6.5)
Neutrophils Relative %: 69 %
Platelets: 219 10*3/uL (ref 150–440)
RBC: 5.02 MIL/uL (ref 3.80–5.20)
RDW: 14.4 % (ref 11.5–14.5)
WBC: 7.6 10*3/uL (ref 3.6–11.0)

## 2017-01-11 LAB — TROPONIN I: Troponin I: <0.03 ng/mL (ref <0.03)

## 2017-01-11 NOTE — ED Provider Notes (Addendum)
[facility] Emergency Department Provider Note        Time seen: ----------------------------------------- 8:44 PM on 01/11/2017 -----------------------------------------    I have reviewed the triage vital signs and the nursing notes.   HISTORY  Chief Complaint Hypertension    HPI Cindy Anthony is a 66 y.o. female who presents to the ER stating she woke up with a blood pressure of 111/60 and since that time it has steadily risen. Currently her pressure was 170/90. Patient states she is to be on blood pressure medicine was but was taken off due to side effects. Recently she's been treated for bronchitis and is taking doxycycline. She denies fevers, chills, chest pain, shortness of breath, vomiting or diarrhea. She has had some dizziness.   Past Medical History:  Diagnosis Date  . Anxiety   . Arthritis   . Depression   . Hyperlipidemia   . Hypertension   . Sleep apnea     Patient Active Problem List   Diagnosis Date Noted  . Chronic mesenteric ischemia (HCC) 07/25/2015  . Bacteriuria with pyuria 07/03/2015  . Left flank pain, chronic 06/30/2015  . Essential hypertension 05/12/2015  . Morbid obesity (HCC) 03/03/2015  . Hip pain 03/03/2015  . GERD (gastroesophageal reflux disease) 03/03/2015  . OSA (obstructive sleep apnea) 03/03/2015    Past Surgical History:  Procedure Laterality Date  . ABDOMINAL HYSTERECTOMY    . BARIATRIC SURGERY  2013   sleeve  . SHOULDER ARTHROSCOPY Right 2007  . TOTAL KNEE ARTHROPLASTY Right 2009    Allergies Amlodipine; Lisinopril; and Other  Social History Social History  Substance Use Topics  . Smoking status: Never Smoker  . Smokeless tobacco: Never Used  . Alcohol use No    Review of Systems Constitutional: Negative for fever. Cardiovascular: Negative for chest pain. Respiratory: Negative for shortness of breath. Gastrointestinal: Negative for abdominal pain, vomiting and  diarrhea. Genitourinary: Negative for dysuria. Musculoskeletal: Negative for back pain. Skin: Negative for rash. Neurological: Negative for headaches, focal weakness or numbness.  10-point ROS otherwise negative.  ____________________________________________   PHYSICAL EXAM:  VITAL SIGNS: ED Triage Vitals  Enc Vitals Group     BP 01/11/17 2027 (!) 161/74     Pulse Rate 01/11/17 2027 70     Resp 01/11/17 2027 16     Temp 01/11/17 2027 98.6 F (37 C)     Temp Source 01/11/17 2027 Oral     SpO2 01/11/17 2027 97 %     Weight 01/11/17 2027 273 lb (123.8 kg)     Height 01/11/17 2027 5\' 4"  (1.626 m)     Head Circumference --      Peak Flow --      Pain Score 01/11/17 2028 0     Pain Loc --      Pain Edu? --      Excl. in GC? --     Constitutional: Alert and oriented. Well appearing and in no distress. Eyes: Conjunctivae are normal. PERRL. Normal extraocular movements. ENT   Head: Normocephalic and atraumatic.   Nose: No congestion/rhinnorhea.   Mouth/Throat: Mucous membranes are moist.   Neck: No stridor. Cardiovascular: Normal rate, regular rhythm. No murmurs, rubs, or gallops. Respiratory: Normal respiratory effort without tachypnea nor retractions. Breath sounds are clear and equal bilaterally. No wheezes/rales/rhonchi. Gastrointestinal: Soft and nontender. Normal bowel sounds Musculoskeletal: Nontender with normal range of motion in all extremities. No lower extremity tenderness nor edema. Neurologic:  Normal speech and language. No gross focal neurologic  deficits are appreciated.  Skin:  Skin is warm, dry and intact. No rash noted. Psychiatric: Mood and affect are normal. Speech and behavior are normal.  ____________________________________________  EKG: Interpreted by me.Sinus bradycardia with rate of 56 bpm, normal PR interval, normal QRS, normal QT.  ____________________________________________  ED COURSE:  Pertinent labs & imaging results that were  available during my care of the patient were reviewed by me and considered in my medical decision making (see chart for details). Patient presents to the ER with his asymptomatic hypertension likely of no significance. She is requesting basic labs.   Procedures ____________________________________________   LABS (pertinent positives/negatives)  Labs Reviewed  BASIC METABOLIC PANEL - Abnormal; Notable for the following:       Result Value   GFR calc non Af Amer 58 (*)    All other components within normal limits  CBC WITH DIFFERENTIAL/PLATELET  TROPONIN I    RADIOLOGY  Chest x-ray Is unremarkable ____________________________________________  FINAL ASSESSMENT AND PLAN  Bronchitis, elevated blood pressure  Plan: Patient with labs and imaging as dictated above. Patient is in no acute distress, workup here has been negative at her request. I see no findings to suggest hypertensive urgency or emergency. She is encouraged to stop her doxycycline and follow-up with her doctor as scheduled   Emily Filbert, MD   Note: This note was generated in part or whole with voice recognition software. Voice recognition is usually quite accurate but there are transcription errors that can and very often do occur. I apologize for any typographical errors that were not detected and corrected.     Emily Filbert, MD 01/11/17 2117    Emily Filbert, MD 01/11/17 2141

## 2017-01-11 NOTE — ED Notes (Signed)
Pt verbalizes understanding of discharge instructions.

## 2017-01-11 NOTE — ED Triage Notes (Signed)
Pt states that when she woke up this morning she noticed a low BP of 111/60 and since that time it has climbed to 170/90. Pt states that she used to be on BP medication but was taken off of the medication due to side effects. Pt has recently been diagnosed with bronchitis and is on medication for that. Pt denies CP at this time. Pt is ambulatory to triage with NAD noted at this time.

## 2017-01-11 NOTE — ED Notes (Signed)
Patient transported to X-ray 

## 2017-01-25 DIAGNOSIS — M545 Low back pain: Secondary | ICD-10-CM | POA: Diagnosis not present

## 2017-01-25 DIAGNOSIS — M5136 Other intervertebral disc degeneration, lumbar region: Secondary | ICD-10-CM | POA: Diagnosis not present

## 2017-02-06 DIAGNOSIS — M47816 Spondylosis without myelopathy or radiculopathy, lumbar region: Secondary | ICD-10-CM | POA: Diagnosis not present

## 2017-02-06 DIAGNOSIS — M545 Low back pain: Secondary | ICD-10-CM | POA: Diagnosis not present

## 2017-02-06 DIAGNOSIS — M5136 Other intervertebral disc degeneration, lumbar region: Secondary | ICD-10-CM | POA: Diagnosis not present

## 2017-02-18 DIAGNOSIS — Z4689 Encounter for fitting and adjustment of other specified devices: Secondary | ICD-10-CM | POA: Diagnosis not present

## 2017-02-21 DIAGNOSIS — I10 Essential (primary) hypertension: Secondary | ICD-10-CM | POA: Diagnosis not present

## 2017-02-21 DIAGNOSIS — Z6841 Body Mass Index (BMI) 40.0 and over, adult: Secondary | ICD-10-CM | POA: Diagnosis not present

## 2017-03-01 ENCOUNTER — Encounter: Payer: Self-pay | Admitting: Emergency Medicine

## 2017-03-01 ENCOUNTER — Other Ambulatory Visit: Payer: Self-pay

## 2017-03-01 ENCOUNTER — Emergency Department
Admission: EM | Admit: 2017-03-01 | Discharge: 2017-03-02 | Disposition: A | Discharge status: Home / Self Care | Payer: Medicare Other | Attending: Emergency Medicine | Admitting: Emergency Medicine

## 2017-03-01 DIAGNOSIS — I10 Essential (primary) hypertension: Secondary | ICD-10-CM | POA: Diagnosis not present

## 2017-03-01 DIAGNOSIS — Z79899 Other long term (current) drug therapy: Secondary | ICD-10-CM | POA: Insufficient documentation

## 2017-03-01 DIAGNOSIS — R42 Dizziness and giddiness: Secondary | ICD-10-CM | POA: Diagnosis present

## 2017-03-01 LAB — CBC
HCT: 43.3 % (ref 35.0–47.0)
HEMOGLOBIN: 14.7 g/dL (ref 12.0–16.0)
MCH: 30.3 pg (ref 26.0–34.0)
MCHC: 33.9 g/dL (ref 32.0–36.0)
MCV: 89.4 fL (ref 80.0–100.0)
Platelets: 237 10*3/uL (ref 150–440)
RBC: 4.85 MIL/uL (ref 3.80–5.20)
RDW: 15.2 % — ABNORMAL HIGH (ref 11.5–14.5)
WBC: 9.6 10*3/uL (ref 3.6–11.0)

## 2017-03-01 NOTE — ED Provider Notes (Signed)
[facility] Emergency Department Provider Note  ____________________________________________   First MD Initiated Contact with Patient 03/01/17 2328     (approximate)  I have reviewed the triage vital signs and the nursing notes.   HISTORY  Chief Complaint Dizziness and Hypertension    HPI Cindy Anthony is a 66 y.o. female with a PMH as listed below who presents with mild lightheadedness/dizziness, elevated BP, and mild SOB.  This has happened to her in the past.  She has had hypertension in the past, and her PCP, Dr. Burnett Sheng, tried her on medications, but they dropped her BP too low and she became dizzy and unstable as a result.  Currently she is on no anti-HTN medications.  She keeps careful track of her BP at home.  After using the computer for awhile, she  got up to go to bed and discovered she felt lightheaded.  She checked her BP and is was in the 160 SBP range.  She lied down in bed and felt a bit of SOB.  She rechecked her BP and it was about 190, so she came to the ED.  She does admit to "going off my diet" and "eating a lot of salt."  She denies CP, N/V/D, abd pain, dysuria.  Symptoms are mild.  Able to ambulate without difficulty.  SOB has completely resolved.  Last similar episode was about 2 months ago, came to ED, had reassuring workup.  At that time, though, she had bronchitis and was on several medications.  This time she reports not being ill recently.  Past Medical History:  Diagnosis Date  . Anxiety   . Arthritis   . Depression   . Hyperlipidemia   . Hypertension   . Sleep apnea     Patient Active Problem List   Diagnosis Date Noted  . Chronic mesenteric ischemia (HCC) 07/25/2015  . Bacteriuria with pyuria 07/03/2015  . Left flank pain, chronic 06/30/2015  . Essential hypertension 05/12/2015  . Morbid obesity (HCC) 03/03/2015  . Hip pain 03/03/2015  . GERD (gastroesophageal reflux disease) 03/03/2015  . OSA (obstructive sleep  apnea) 03/03/2015    Past Surgical History:  Procedure Laterality Date  . ABDOMINAL HYSTERECTOMY    . BARIATRIC SURGERY  2013   sleeve  . SHOULDER ARTHROSCOPY Right 2007  . TOTAL KNEE ARTHROPLASTY Right 2009    Prior to Admission medications   Medication Sig Start Date End Date Taking? Authorizing Provider  gabapentin (NEURONTIN) 300 MG capsule Take 300 mg by mouth 3 (three) times daily.    Historical Provider, MD  LORazepam (ATIVAN) 0.5 MG tablet Take 0.5 mg by mouth 2 (two) times daily as needed for anxiety.     Historical Provider, MD  omeprazole (PRILOSEC) 40 MG capsule Take 40 mg by mouth daily as needed (acid reflux).     Historical Provider, MD    Allergies Amlodipine; Lisinopril; and Other  Family History  Problem Relation Age of Onset  . Arthritis Mother   . Hyperlipidemia Mother   . Heart disease Mother   . Stroke Mother   . Hypertension Mother   . Kidney disease Mother   . Diabetes Mother   . Colon polyps Mother   . Heart attack Mother   . Arthritis Father   . Heart disease Father   . Diabetes Paternal Grandmother   . Hyperlipidemia Sister   . Hypertension Brother     Social History Social History  Substance Use Topics  . Smoking status: Never  Smoker  . Smokeless tobacco: Never Used  . Alcohol use No    Review of Systems Constitutional: No fever/chills Eyes: No visual changes. ENT: No sore throat. Cardiovascular: Denies chest pain. Respiratory: Mild shortness of breath, now resolved Gastrointestinal: No abdominal pain.  No nausea, no vomiting.  No diarrhea.  No constipation. Genitourinary: Negative for dysuria. Musculoskeletal: Negative for back pain. Skin: Negative for rash. Neurological: Mild lightheadedness.  Negative for headaches, focal weakness or numbness.  10-point ROS otherwise negative.  ____________________________________________   PHYSICAL EXAM:  VITAL SIGNS: ED Triage Vitals  Enc Vitals Group     BP 03/01/17 2321 (!)  185/75     Pulse Rate 03/01/17 2321 (!) 57     Resp 03/01/17 2321 16     Temp 03/01/17 2321 98.7 F (37.1 C)     Temp Source 03/01/17 2321 Oral     SpO2 03/01/17 2321 98 %     Weight 03/01/17 2315 270 lb (122.5 kg)     Height 03/01/17 2315  (1.626 m)     Head Circumference --      Peak Flow --      Pain Score 03/01/17 2315 5     Pain Loc --      Pain Edu? --      Excl. in GC? --     Constitutional: Alert and oriented. Well appearing and in no acute distress. Eyes: Conjunctivae are normal. PERRL. EOMI. Head: Atraumatic. Nose: No congestion/rhinnorhea. Mouth/Throat: Mucous membranes are moist. Neck: No stridor.  No meningeal signs.   Cardiovascular: Normal rate, regular rhythm. Good peripheral circulation. Grossly normal heart sounds. Respiratory: Normal respiratory effort.  No retractions. Lungs CTAB. Gastrointestinal: Soft and nontender. No distention.  Musculoskeletal: No lower extremity tenderness nor edema. No gross deformities of extremities. Neurologic:  Normal speech and language. No gross focal neurologic deficits are appreciated.  Skin:  Skin is warm, dry and intact. No rash noted. Psychiatric: Mood and affect are normal. Speech and behavior are normal.  ____________________________________________   LABS (all labs ordered are listed, but only abnormal results are displayed)  Labs Reviewed  BASIC METABOLIC PANEL - Abnormal; Notable for the following:       Result Value   Glucose, Bld 101 (*)    All other components within normal limits  CBC - Abnormal; Notable for the following:    RDW 15.2 (*)    All other components within normal limits  URINALYSIS, COMPLETE (UACMP) WITH MICROSCOPIC - Abnormal; Notable for the following:    Color, Urine YELLOW (*)    APPearance HAZY (*)    Leukocytes, UA MODERATE (*)    Squamous Epithelial / LPF 0-5 (*)    All other components within normal limits  URINE CULTURE  TROPONIN I  CBG MONITORING, ED    ____________________________________________  EKG  ED ECG REPORT I, Klever Twyford, the attending physician, personally viewed and interpreted this ECG.  Date: 03/01/2017 EKG Time: 23:19 Rate: 57 Rhythm: Borderline sinus bradycardia QRS Axis: normal Intervals: normal ST/T Wave abnormalities: normal Conduction Disturbances: none Narrative Interpretation: unremarkable  ____________________________________________  RADIOLOGY   No results found.  ____________________________________________   PROCEDURES  Critical Care performed: No   Procedure(s) performed:   Procedures   ____________________________________________   INITIAL IMPRESSION / ASSESSMENT AND PLAN / ED COURSE  Pertinent labs & imaging results that were available during my care of the patient were reviewed by me and considered in my medical decision making (see chart for details).  The  patient is well-appearing and in no distress.  It sounds as if she has fairly labile blood pressures that typically do not require control but she seems particularly sensitive to dietary indiscretions and any other systemic illness (such as bronchitis) or medication she is taking.  Her EKG is reassuring.  We will check basic labs to make sure there is no evidence of hypertensive urgency.  I had my usual and customary blood pressure/hypertension discussion and explained why we would likely not treat her at least with prescribed medications at the most we would provide a dose of oral blood pressure medication if her blood pressure remains elevated and if she remains lightheaded.  At this point I do not feel there is any indication for imaging.  She has only very mild symptoms at this time, is completely neurologically intact, and has a PCP with whom she can follow-up.  She understands and agrees with the plan.   Clinical Course as of Mar 02 114  Sat Mar 02, 2017  0046 Labs reassuring.  Will check on patient shortly to determine  if lightheadedness and blood pressure have improved.  [CF]  0108 Patient has some leukocytes but no other evidence of infection.  I will send a urine culture but did not treat empirically. Leukocytes, UA: (!) MODERATE [CF]  0112 BP still elevated but patient states she feels much better and is currently asymptomatic.  Reinforced my prior discussion about treating the patient, not numbers, and she has no evidence of hypertensive urgency/emergency at this time.  She will stick to a heart healthy diet low in sodium and follow-up with her primary care doctor on Monday.  I gave my usual and customary return precautions.     [CF]    Clinical Course User Index [CF] Loleta Rose, MD    ____________________________________________  FINAL CLINICAL IMPRESSION(S) / ED DIAGNOSES  Final diagnoses:  Essential hypertension     MEDICATIONS GIVEN DURING THIS VISIT:  Medications - No data to display   NEW OUTPATIENT MEDICATIONS STARTED DURING THIS VISIT:  New Prescriptions   No medications on file    Modified Medications   No medications on file    Discontinued Medications   No medications on file     Note:  This document was prepared using Dragon voice recognition software and may include unintentional dictation errors.    Loleta Rose, MD 03/02/17 [phone]

## 2017-03-01 NOTE — ED Triage Notes (Signed)
Pt ambulatory to triage in NAD, reports elevated BP (190s at home), dizziness, and SOB.  Denies CP.

## 2017-03-01 NOTE — ED Notes (Addendum)
Pt reports hx of HTN but has, with PCP Dr Delia Chimes, ordered no meds control HTN  With exercise and diet, reports usual BP is "130s"

## 2017-03-02 DIAGNOSIS — I10 Essential (primary) hypertension: Secondary | ICD-10-CM | POA: Diagnosis not present

## 2017-03-02 LAB — URINALYSIS, COMPLETE (UACMP) WITH MICROSCOPIC
BILIRUBIN URINE: NEGATIVE
Bacteria, UA: NONE SEEN
GLUCOSE, UA: NEGATIVE mg/dL
Hgb urine dipstick: NEGATIVE
KETONES UR: NEGATIVE mg/dL
Nitrite: NEGATIVE
PROTEIN: NEGATIVE mg/dL
Specific Gravity, Urine: 1.024 (ref 1.005–1.030)
pH: 5 (ref 5.0–8.0)

## 2017-03-02 LAB — BASIC METABOLIC PANEL
ANION GAP: 6 (ref 5–15)
BUN: 17 mg/dL (ref 6–20)
CHLORIDE: 105 mmol/L (ref 101–111)
CO2: 28 mmol/L (ref 22–32)
CREATININE: 0.84 mg/dL (ref 0.44–1.00)
Calcium: 9.4 mg/dL (ref 8.9–10.3)
GFR calc non Af Amer: >60 mL/min (ref >60)
Glucose, Bld: 101 mg/dL — ABNORMAL HIGH (ref 65–99)
Potassium: 4.1 mmol/L (ref 3.5–5.1)
SODIUM: 139 mmol/L (ref 135–145)

## 2017-03-02 LAB — TROPONIN I: Troponin I: <0.03 ng/mL (ref <0.03)

## 2017-03-02 NOTE — Discharge Instructions (Signed)

## 2017-03-03 LAB — URINE CULTURE
Culture: 60000 — AB
Special Requests: NORMAL

## 2017-03-04 DIAGNOSIS — I10 Essential (primary) hypertension: Secondary | ICD-10-CM | POA: Diagnosis not present

## 2017-03-04 DIAGNOSIS — Z6841 Body Mass Index (BMI) 40.0 and over, adult: Secondary | ICD-10-CM | POA: Diagnosis not present

## 2017-04-05 DIAGNOSIS — M25561 Pain in right knee: Secondary | ICD-10-CM | POA: Diagnosis not present

## 2017-04-05 DIAGNOSIS — G8929 Other chronic pain: Secondary | ICD-10-CM | POA: Diagnosis not present

## 2017-04-05 DIAGNOSIS — Z96651 Presence of right artificial knee joint: Secondary | ICD-10-CM | POA: Diagnosis not present

## 2017-04-05 DIAGNOSIS — M7631 Iliotibial band syndrome, right leg: Secondary | ICD-10-CM | POA: Diagnosis not present

## 2017-04-10 ENCOUNTER — Encounter: Payer: Self-pay | Admitting: Pulmonary Disease

## 2017-04-25 DIAGNOSIS — T84022D Instability of internal right knee prosthesis, subsequent encounter: Secondary | ICD-10-CM | POA: Diagnosis not present

## 2017-05-08 DIAGNOSIS — I10 Essential (primary) hypertension: Secondary | ICD-10-CM | POA: Diagnosis not present

## 2017-05-08 DIAGNOSIS — R5383 Other fatigue: Secondary | ICD-10-CM | POA: Diagnosis not present

## 2017-05-08 DIAGNOSIS — R42 Dizziness and giddiness: Secondary | ICD-10-CM | POA: Diagnosis not present

## 2017-05-09 DIAGNOSIS — M47816 Spondylosis without myelopathy or radiculopathy, lumbar region: Secondary | ICD-10-CM | POA: Diagnosis not present

## 2017-05-09 DIAGNOSIS — M5116 Intervertebral disc disorders with radiculopathy, lumbar region: Secondary | ICD-10-CM | POA: Diagnosis not present

## 2017-05-12 DIAGNOSIS — R0789 Other chest pain: Secondary | ICD-10-CM | POA: Diagnosis not present

## 2017-05-12 DIAGNOSIS — R079 Chest pain, unspecified: Secondary | ICD-10-CM | POA: Diagnosis not present

## 2017-05-12 DIAGNOSIS — R42 Dizziness and giddiness: Secondary | ICD-10-CM | POA: Diagnosis not present

## 2017-05-13 ENCOUNTER — Ambulatory Visit: Payer: Medicare Other | Admitting: Pulmonary Disease

## 2017-05-20 DIAGNOSIS — M5441 Lumbago with sciatica, right side: Secondary | ICD-10-CM | POA: Insufficient documentation

## 2017-05-20 DIAGNOSIS — M47816 Spondylosis without myelopathy or radiculopathy, lumbar region: Secondary | ICD-10-CM | POA: Diagnosis not present

## 2017-05-20 DIAGNOSIS — M4726 Other spondylosis with radiculopathy, lumbar region: Secondary | ICD-10-CM | POA: Diagnosis not present

## 2017-05-24 DIAGNOSIS — R42 Dizziness and giddiness: Secondary | ICD-10-CM | POA: Diagnosis not present

## 2017-05-24 DIAGNOSIS — I10 Essential (primary) hypertension: Secondary | ICD-10-CM | POA: Diagnosis not present

## 2017-05-28 DIAGNOSIS — Z96659 Presence of unspecified artificial knee joint: Secondary | ICD-10-CM | POA: Diagnosis not present

## 2017-05-28 DIAGNOSIS — M1711 Unilateral primary osteoarthritis, right knee: Secondary | ICD-10-CM | POA: Diagnosis not present

## 2017-05-28 DIAGNOSIS — M1712 Unilateral primary osteoarthritis, left knee: Secondary | ICD-10-CM | POA: Diagnosis not present

## 2017-06-04 DIAGNOSIS — I10 Essential (primary) hypertension: Secondary | ICD-10-CM | POA: Diagnosis not present

## 2017-06-04 DIAGNOSIS — Z6841 Body Mass Index (BMI) 40.0 and over, adult: Secondary | ICD-10-CM | POA: Diagnosis not present

## 2017-06-04 DIAGNOSIS — Z9989 Dependence on other enabling machines and devices: Secondary | ICD-10-CM | POA: Diagnosis not present

## 2017-06-04 DIAGNOSIS — G4733 Obstructive sleep apnea (adult) (pediatric): Secondary | ICD-10-CM | POA: Diagnosis not present

## 2017-06-04 DIAGNOSIS — Z79899 Other long term (current) drug therapy: Secondary | ICD-10-CM | POA: Diagnosis not present

## 2017-06-18 DIAGNOSIS — M1712 Unilateral primary osteoarthritis, left knee: Secondary | ICD-10-CM | POA: Diagnosis not present

## 2017-06-19 DIAGNOSIS — G4733 Obstructive sleep apnea (adult) (pediatric): Secondary | ICD-10-CM | POA: Diagnosis not present

## 2017-06-19 DIAGNOSIS — Z6841 Body Mass Index (BMI) 40.0 and over, adult: Secondary | ICD-10-CM | POA: Diagnosis not present

## 2017-06-19 DIAGNOSIS — Z9989 Dependence on other enabling machines and devices: Secondary | ICD-10-CM | POA: Diagnosis not present

## 2017-06-19 DIAGNOSIS — F411 Generalized anxiety disorder: Secondary | ICD-10-CM | POA: Diagnosis not present

## 2017-06-19 DIAGNOSIS — K210 Gastro-esophageal reflux disease with esophagitis: Secondary | ICD-10-CM | POA: Diagnosis not present

## 2017-06-19 DIAGNOSIS — I10 Essential (primary) hypertension: Secondary | ICD-10-CM | POA: Diagnosis not present

## 2017-06-25 DIAGNOSIS — M1712 Unilateral primary osteoarthritis, left knee: Secondary | ICD-10-CM | POA: Diagnosis not present

## 2017-07-01 DIAGNOSIS — M1712 Unilateral primary osteoarthritis, left knee: Secondary | ICD-10-CM | POA: Diagnosis not present

## 2017-07-15 DIAGNOSIS — H8110 Benign paroxysmal vertigo, unspecified ear: Secondary | ICD-10-CM | POA: Diagnosis not present

## 2017-07-18 DIAGNOSIS — M5116 Intervertebral disc disorders with radiculopathy, lumbar region: Secondary | ICD-10-CM | POA: Diagnosis not present

## 2017-07-23 DIAGNOSIS — H8110 Benign paroxysmal vertigo, unspecified ear: Secondary | ICD-10-CM | POA: Diagnosis not present

## 2017-07-23 DIAGNOSIS — R42 Dizziness and giddiness: Secondary | ICD-10-CM | POA: Diagnosis not present

## 2017-07-23 DIAGNOSIS — I10 Essential (primary) hypertension: Secondary | ICD-10-CM | POA: Diagnosis not present

## 2017-07-23 DIAGNOSIS — J301 Allergic rhinitis due to pollen: Secondary | ICD-10-CM | POA: Diagnosis not present

## 2017-08-08 ENCOUNTER — Encounter: Payer: Self-pay | Admitting: Pulmonary Disease

## 2017-08-08 ENCOUNTER — Ambulatory Visit (INDEPENDENT_AMBULATORY_CARE_PROVIDER_SITE_OTHER): Payer: Medicare Other | Admitting: Pulmonary Disease

## 2017-08-08 DIAGNOSIS — G4733 Obstructive sleep apnea (adult) (pediatric): Secondary | ICD-10-CM | POA: Diagnosis not present

## 2017-08-08 NOTE — Assessment & Plan Note (Signed)
Weight loss was encouraged 

## 2017-08-08 NOTE — Progress Notes (Signed)
   Subjective:    Patient ID: Cindy Anthony, female    DOB: 11/18/1951, 66 y.o.   MRN: [id number]  HPI  66 year old  for FU of  obstructive sleep apnea. [place]  [place], works as a Education officer, environmentalcollege instructor. She was diagnosed with OSA in 2008, maintained on CPAP 7 cm with a small nasal mask.  She underwent gastric bypass in 2010 , her peak weight was 288 , now at 269  She obtained a new machine in 04/2016 and this has worked well for her. She denies daytime somnolence or fatigue. She denies problems with mask or pressure. She is worried about the oncoming storm and losing power and not being able to use her CPAP. She absolutely cannot sleep without her CPAP machine  CPAP download shows excellent control of events on 7 cm and minimal leak and excellent compliance more than 7 hours per night     Significant tests/ events  02/2009 CPAP titration >> 7 cm   Review of Systems Patient denies significant dyspnea,cough, hemoptysis,  chest pain, palpitations, pedal edema, orthopnea, paroxysmal nocturnal dyspnea, lightheadedness, nausea, vomiting, abdominal or  leg pains      Objective:   Physical Exam   Gen. Pleasant, obese, in no distress ENT - no lesions, no post nasal drip Neck: No JVD, no thyromegaly, no carotid bruits Lungs: no use of accessory muscles, no dullness to percussion, decreased without rales or rhonchi  Cardiovascular: Rhythm regular, heart sounds  normal, no murmurs or gallops, no peripheral edema Musculoskeletal: No deformities, no cyanosis or clubbing , no tremors        Assessment & Plan:

## 2017-08-08 NOTE — Assessment & Plan Note (Signed)
CPAP is set at 7 cm. CPAP supplies will be renewed for 1 year  Weight loss encouraged, compliance with goal of at least 4-6 hrs every night is the expectation. Advised against medications with sedative side effects Cautioned against driving when sleepy - understanding that sleepiness will vary on a day to day basis

## 2017-08-08 NOTE — Patient Instructions (Signed)
CPAP is set at 7 cm CPAP supplies will be renewed for 1 year 

## 2017-08-19 DIAGNOSIS — Z8371 Family history of colonic polyps: Secondary | ICD-10-CM | POA: Diagnosis not present

## 2017-08-19 DIAGNOSIS — K219 Gastro-esophageal reflux disease without esophagitis: Secondary | ICD-10-CM | POA: Diagnosis not present

## 2017-08-19 DIAGNOSIS — Z800 Family history of malignant neoplasm of digestive organs: Secondary | ICD-10-CM | POA: Diagnosis not present

## 2017-08-26 DIAGNOSIS — I10 Essential (primary) hypertension: Secondary | ICD-10-CM | POA: Diagnosis not present

## 2017-08-26 DIAGNOSIS — Z6841 Body Mass Index (BMI) 40.0 and over, adult: Secondary | ICD-10-CM | POA: Diagnosis not present

## 2017-08-26 DIAGNOSIS — Z0000 Encounter for general adult medical examination without abnormal findings: Secondary | ICD-10-CM | POA: Diagnosis not present

## 2017-08-26 DIAGNOSIS — Z9989 Dependence on other enabling machines and devices: Secondary | ICD-10-CM | POA: Diagnosis not present

## 2017-08-26 DIAGNOSIS — M899 Disorder of bone, unspecified: Secondary | ICD-10-CM | POA: Diagnosis not present

## 2017-08-26 DIAGNOSIS — R5383 Other fatigue: Secondary | ICD-10-CM | POA: Diagnosis not present

## 2017-08-26 DIAGNOSIS — E559 Vitamin D deficiency, unspecified: Secondary | ICD-10-CM | POA: Diagnosis not present

## 2017-08-26 DIAGNOSIS — G4733 Obstructive sleep apnea (adult) (pediatric): Secondary | ICD-10-CM | POA: Diagnosis not present

## 2017-08-26 DIAGNOSIS — Z9884 Bariatric surgery status: Secondary | ICD-10-CM | POA: Diagnosis not present

## 2017-08-26 DIAGNOSIS — Z1231 Encounter for screening mammogram for malignant neoplasm of breast: Secondary | ICD-10-CM | POA: Diagnosis not present

## 2017-08-29 DIAGNOSIS — Z23 Encounter for immunization: Secondary | ICD-10-CM | POA: Diagnosis not present

## 2017-09-09 DIAGNOSIS — Z96651 Presence of right artificial knee joint: Secondary | ICD-10-CM | POA: Diagnosis not present

## 2017-09-09 DIAGNOSIS — M25561 Pain in right knee: Secondary | ICD-10-CM | POA: Diagnosis not present

## 2017-09-16 DIAGNOSIS — J3489 Other specified disorders of nose and nasal sinuses: Secondary | ICD-10-CM | POA: Diagnosis not present

## 2017-09-16 DIAGNOSIS — R05 Cough: Secondary | ICD-10-CM | POA: Diagnosis not present

## 2017-10-06 ENCOUNTER — Other Ambulatory Visit: Payer: Self-pay

## 2017-10-06 ENCOUNTER — Encounter: Payer: Self-pay | Admitting: Emergency Medicine

## 2017-10-06 ENCOUNTER — Emergency Department
Admission: EM | Admit: 2017-10-06 | Discharge: 2017-10-06 | Disposition: A | Discharge status: Home / Self Care | Payer: Medicare Other | Attending: Emergency Medicine | Admitting: Emergency Medicine

## 2017-10-06 ENCOUNTER — Emergency Department: Payer: Medicare Other

## 2017-10-06 DIAGNOSIS — M25511 Pain in right shoulder: Secondary | ICD-10-CM | POA: Diagnosis not present

## 2017-10-06 DIAGNOSIS — Y999 Unspecified external cause status: Secondary | ICD-10-CM | POA: Insufficient documentation

## 2017-10-06 DIAGNOSIS — X58XXXA Exposure to other specified factors, initial encounter: Secondary | ICD-10-CM | POA: Insufficient documentation

## 2017-10-06 DIAGNOSIS — Z79899 Other long term (current) drug therapy: Secondary | ICD-10-CM | POA: Diagnosis not present

## 2017-10-06 DIAGNOSIS — S4991XA Unspecified injury of right shoulder and upper arm, initial encounter: Secondary | ICD-10-CM | POA: Diagnosis present

## 2017-10-06 DIAGNOSIS — S46911A Strain of unspecified muscle, fascia and tendon at shoulder and upper arm level, right arm, initial encounter: Secondary | ICD-10-CM | POA: Insufficient documentation

## 2017-10-06 DIAGNOSIS — Y93A3 Activity, aerobic and step exercise: Secondary | ICD-10-CM | POA: Diagnosis not present

## 2017-10-06 DIAGNOSIS — Y929 Unspecified place or not applicable: Secondary | ICD-10-CM | POA: Diagnosis not present

## 2017-10-06 DIAGNOSIS — I10 Essential (primary) hypertension: Secondary | ICD-10-CM | POA: Diagnosis not present

## 2017-10-06 MED ORDER — TRAMADOL HCL 50 MG PO TABS: 50.0000 mg | ORAL_TABLET | Freq: Two times a day (BID) | ORAL | 0 refills | Status: DC | PRN

## 2017-10-06 MED ORDER — OXYCODONE-ACETAMINOPHEN 5-325 MG PO TABS
1.0000 | ORAL_TABLET | Freq: Once | ORAL | Status: DC
  Filled 2017-10-06: qty 1

## 2017-10-06 MED ORDER — NAPROXEN 500 MG PO TABS: 500.0000 mg | ORAL_TABLET | Freq: Two times a day (BID) | ORAL | Status: DC

## 2017-10-06 NOTE — ED Triage Notes (Signed)
Pt ambulatory to triage with c/o right shoulder pain x 1 month. Pt noticed injury after water aerobics at that time and has been seen by her Dr. Rock NephewPt concerned for possible rotator cuff injury at this time and in NAD.

## 2017-10-06 NOTE — ED Notes (Signed)
Patient reports right shoulder pain for "awhile", when questioned further reports approximately 2 months.  Reports history of surgery to same shoulder about 8 years ago. Patient reports some times when she wakes up and has pain down the arm.  Reports has noticed pain since she started exercising.

## 2017-10-06 NOTE — ED Provider Notes (Signed)
[facility] Emergency Department Provider Note   ____________________________________________   First MD Initiated Contact with Patient 10/06/17 2112     (approximate)  I have reviewed the triage vital signs and the nursing notes.   HISTORY  Chief Complaint Shoulder Pain    HPI Cindy Anthony is a 66 y.o. female patient complaining of right shoulder pain for 1 month. Patient states she noticed increased pain after water aerobics one month ago. Patient states she was seen by her Dr. and he was concerned she might have possible rotator cuff injury.    Past Medical History:  Diagnosis Date  . Anxiety   . Arthritis   . Depression   . Hyperlipidemia   . Hypertension   . Sleep apnea     Patient Active Problem List   Diagnosis Date Noted  . Chronic mesenteric ischemia (HCC) 07/25/2015  . Bacteriuria with pyuria 07/03/2015  . Left flank pain, chronic 06/30/2015  . Essential hypertension 05/12/2015  . Morbid obesity (HCC) 03/03/2015  . Hip pain 03/03/2015  . GERD (gastroesophageal reflux disease) 03/03/2015  . OSA (obstructive sleep apnea) 03/03/2015    Past Surgical History:  Procedure Laterality Date  . ABDOMINAL HYSTERECTOMY    . BARIATRIC SURGERY  2013   sleeve  . SHOULDER ARTHROSCOPY Right 2007  . TOTAL KNEE ARTHROPLASTY Right 2009    Prior to Admission medications   Medication Sig Start Date End Date Taking? Authorizing Provider  LORazepam (ATIVAN) 0.5 MG tablet Take 0.5 mg by mouth 2 (two) times daily as needed for anxiety.     [provider]  losartan-hydrochlorothiazide (HYZAAR) 50-12.5 MG tablet Take 1 tablet by mouth daily.    [provider]  naproxen (NAPROSYN) 500 MG tablet Take 1 tablet (500 mg total) 2 (two) times daily with a meal by mouth. 10/06/17   Joni ReiningSmith, Tylerjames Hoglund K, PA-C  omeprazole (PRILOSEC) 40 MG capsule Take 40 mg by mouth daily as needed (acid reflux).     [provider]  traMADol  (ULTRAM) 50 MG tablet Take 1 tablet (50 mg total) every 12 (twelve) hours as needed by mouth. 10/06/17   Joni ReiningSmith, Ellington Cornia K, PA-C    Allergies Amlodipine; Lisinopril; and Other  Family History  Problem Relation Age of Onset  . Arthritis Mother   . Hyperlipidemia Mother   . Heart disease Mother   . Stroke Mother   . Hypertension Mother   . Kidney disease Mother   . Diabetes Mother   . Colon polyps Mother   . Heart attack Mother   . Arthritis Father   . Heart disease Father   . Diabetes Paternal Grandmother   . Hyperlipidemia Sister   . Hypertension Brother     Social History Social History   Tobacco Use  . Smoking status: Never Smoker  . Smokeless tobacco: Never Used  Substance Use Topics  . Alcohol use: No    Alcohol/week: 0.0 oz  . Drug use: No    Review of Systems Constitutional: No fever/chills Eyes: No visual changes. ENT: No sore throat. Cardiovascular: Denies chest pain. Respiratory: Denies shortness of breath. Gastrointestinal: No abdominal pain.  No nausea, no vomiting.  No diarrhea.  No constipation. Genitourinary: Negative for dysuria. Musculoskeletal: Right shoulder pain Skin: Negative for rash. Neurological: Negative for headaches, focal weakness or numbness. Psychiatric:Anxiety and depression Endocrine:Hyperlipidemia hypertension Allergic/Immunilogical: See medication list  ____________________________________________   PHYSICAL EXAM:  VITAL SIGNS: ED Triage Vitals  Enc Vitals Group     BP  10/06/17 2013 (!) 150/72     Pulse Rate 10/06/17 2013 (!) 55     Resp 10/06/17 2013 18     Temp 10/06/17 2013 97.6 F (36.4 C)     Temp Source 10/06/17 2013 Oral     SpO2 10/06/17 2013 98 %     Weight 10/06/17 2009 266 lb (120.7 kg)     Height 10/06/17 2009 5\' 4"  (1.626 m)     Head Circumference --      Peak Flow --      Pain Score 10/06/17 2009 10     Pain Loc --      Pain Edu? --      Excl. in GC? --    Constitutional: Alert and oriented. Well  appearing and in no acute distress. Neck: No stridor.  No cervical spine tenderness to palpation. Hematological/Lymphatic/Immunilogical: No cervical lymphadenopathy. Cardiovascular: Normal rate, regular rhythm. Grossly normal heart sounds.  Good peripheral circulation. Respiratory: Normal respiratory effort.  No retractions. Lungs CTAB. Gastrointestinal: Soft and nontender. No distention. No abdominal bruits. No CVA tenderness. Musculoskeletal: No obvious deformity of the right shoulder. Patient has decreased range of motion with adduction. Patient struggled against resistance is 3/5. Neurologic:  Normal speech and language. No gross focal neurologic deficits are appreciated. No gait instability. Skin:  Skin is warm, dry and intact. No rash noted. Psychiatric: Mood and affect are normal. Speech and behavior are normal.  ____________________________________________   LABS (all labs ordered are listed, but only abnormal results are displayed)  Labs Reviewed - No data to display ____________________________________________  EKG   ____________________________________________  RADIOLOGY  Dg Shoulder Right  Result Date: 10/06/2017 CLINICAL DATA:  66 y/o F; 1 month of right shoulder pain progressively getting worse. EXAM: RIGHT SHOULDER - 2+ VIEW COMPARISON:  None. FINDINGS: No acute fracture or dislocation identified. Postsurgical changes related to rotator cuff repair. Subacromial enthesophyte. Mild irregularity of the inferior glenoid rim may represent sequelae of old trauma. IMPRESSION: 1. No acute fracture or dislocation. 2. Subacromial enthesophyte. Postsurgical changes of rotator cuff repair. Electronically Signed   By: Mitzi HansenLance  Furusawa-Stratton M.D.   On: 10/06/2017 21:45    ____________________________________________   PROCEDURES  Procedure(s) performed: None  Procedures  Critical Care performed: No  ____________________________________________   INITIAL IMPRESSION /  ASSESSMENT AND PLAN / ED COURSE  As part of my medical decision making, I reviewed the following data within the electronic MEDICAL RECORD NUMBER    Right shoulder pain secondary to rotator cuff tendinitis. Discussed x-ray findings with patient. Patient given discharge care instructions. Patient does take medication as directed and follow up PCP for continual care.      ____________________________________________   FINAL CLINICAL IMPRESSION(S) / ED DIAGNOSES  Final diagnoses:  Strain of right shoulder, initial encounter     ED Discharge Orders        Ordered    naproxen (NAPROSYN) 500 MG tablet  2 times daily with meals     10/06/17 2220    traMADol (ULTRAM) 50 MG tablet  Every 12 hours PRN     10/06/17 2220       Note:  This document was prepared using Dragon voice recognition software and may include unintentional dictation errors.    Joni ReiningSmith, Julaine Zimny K, PA-C 10/06/17 2225    Dionne BucySiadecki, Sebastian, MD 10/07/17 [phone]

## 2017-10-07 DIAGNOSIS — R300 Dysuria: Secondary | ICD-10-CM | POA: Diagnosis not present

## 2017-10-07 DIAGNOSIS — M25511 Pain in right shoulder: Secondary | ICD-10-CM | POA: Diagnosis not present

## 2017-10-07 DIAGNOSIS — Z9889 Other specified postprocedural states: Secondary | ICD-10-CM | POA: Diagnosis not present

## 2017-10-07 DIAGNOSIS — M13811 Other specified arthritis, right shoulder: Secondary | ICD-10-CM | POA: Diagnosis not present

## 2017-10-25 DIAGNOSIS — R198 Other specified symptoms and signs involving the digestive system and abdomen: Secondary | ICD-10-CM | POA: Diagnosis not present

## 2017-10-25 DIAGNOSIS — R0981 Nasal congestion: Secondary | ICD-10-CM | POA: Diagnosis not present

## 2017-10-25 DIAGNOSIS — M899 Disorder of bone, unspecified: Secondary | ICD-10-CM | POA: Diagnosis not present

## 2017-10-25 DIAGNOSIS — E559 Vitamin D deficiency, unspecified: Secondary | ICD-10-CM | POA: Diagnosis not present

## 2017-10-25 DIAGNOSIS — R42 Dizziness and giddiness: Secondary | ICD-10-CM | POA: Diagnosis not present

## 2017-10-25 DIAGNOSIS — Z1231 Encounter for screening mammogram for malignant neoplasm of breast: Secondary | ICD-10-CM | POA: Diagnosis not present

## 2017-11-11 ENCOUNTER — Ambulatory Visit
Admission: RE | Admit: 2017-11-11 | Payer: Medicare Other | Source: Ambulatory Visit | Admitting: Unknown Physician Specialty

## 2017-11-11 ENCOUNTER — Encounter: Admission: RE | Payer: Self-pay | Source: Ambulatory Visit

## 2017-11-11 SURGERY — ESOPHAGOGASTRODUODENOSCOPY (EGD) WITH PROPOFOL
Anesthesia: General

## 2017-11-15 DIAGNOSIS — M5116 Intervertebral disc disorders with radiculopathy, lumbar region: Secondary | ICD-10-CM | POA: Diagnosis not present

## 2017-12-25 IMAGING — CR DG CHEST 2V
2 series · 2 of 2 positions shown · non-contrast
Comparison: None.

CLINICAL DATA: Acute onset of fever, cough and congestion. Initial
encounter.

EXAM:
CHEST  2 VIEW

[chest pa]
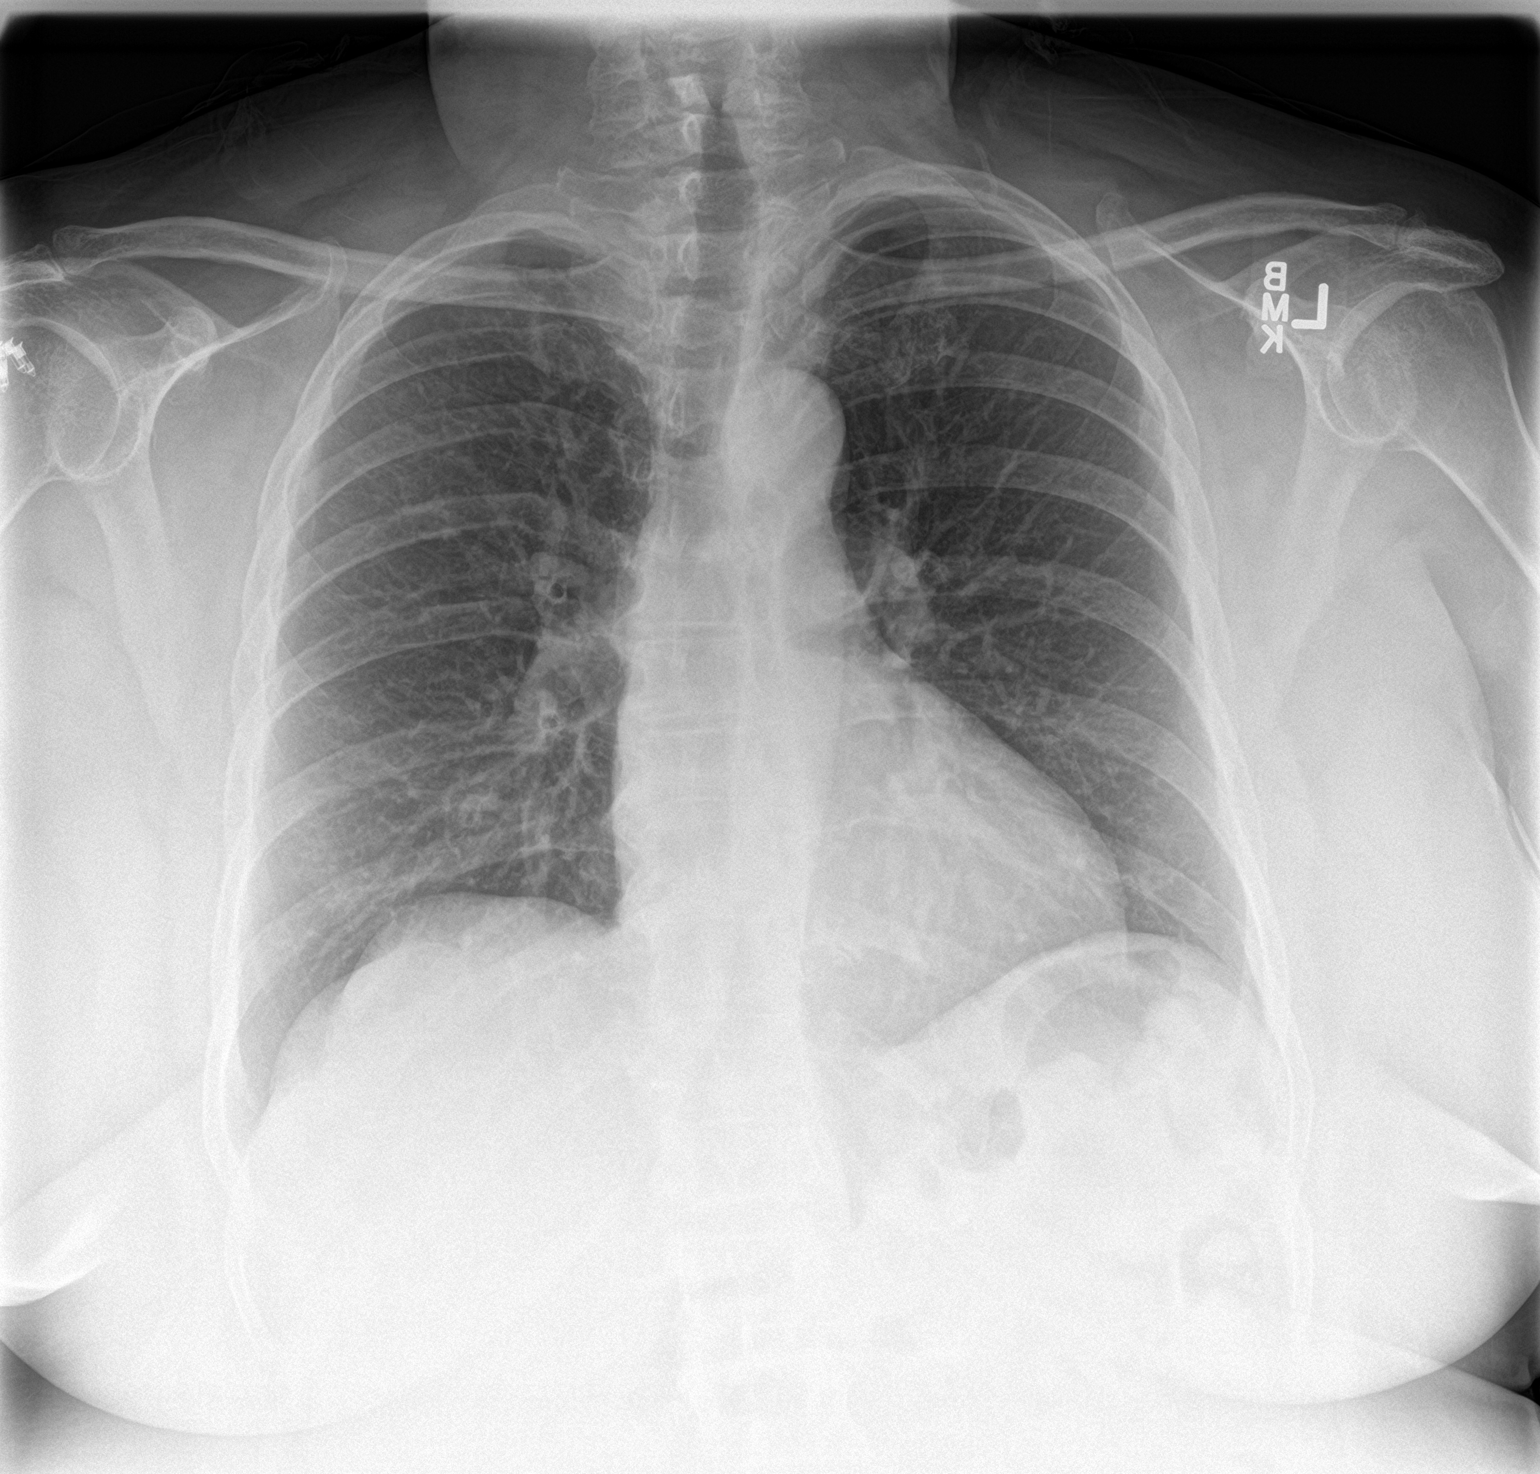

[chest lat]
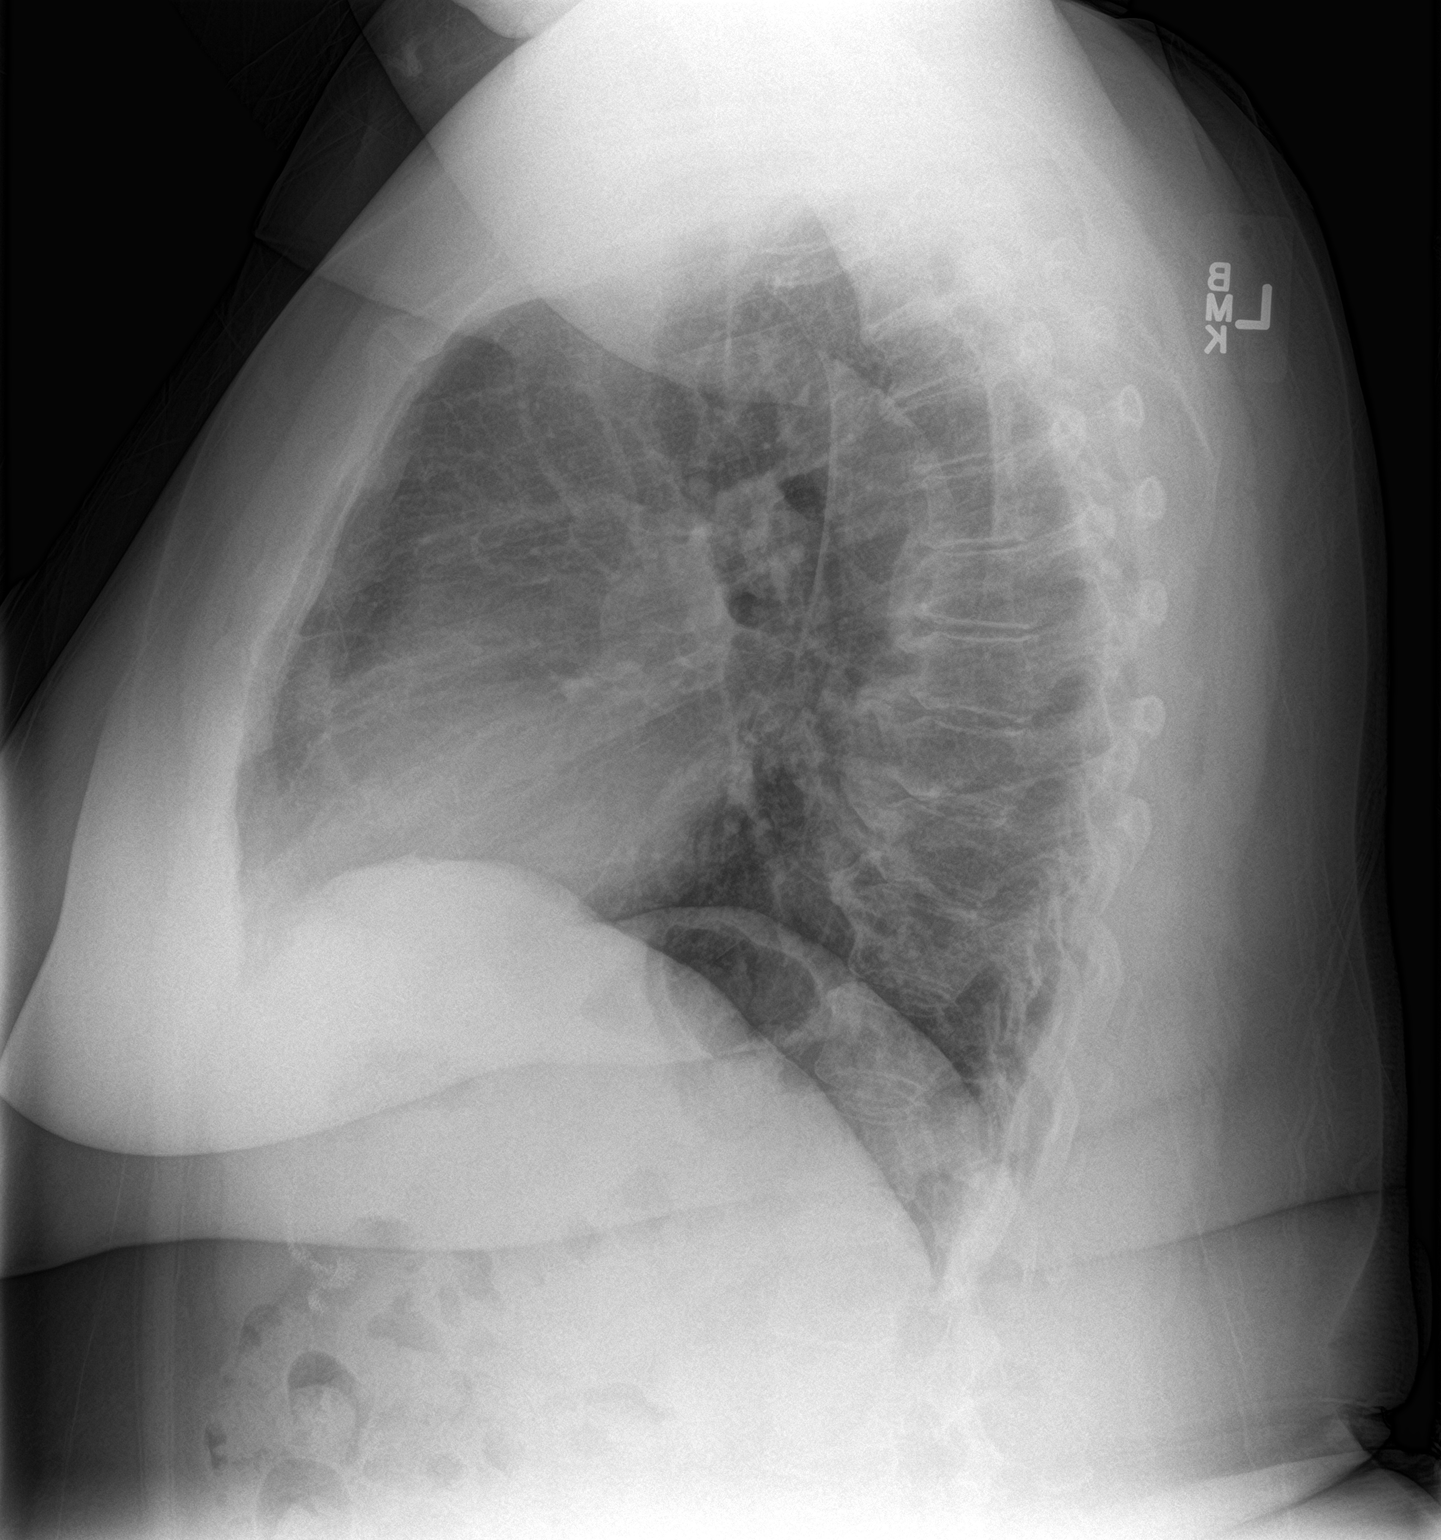

[2 of 2 positions shown; findings below may reference images not displayed]

FINDINGS: The lungs are well-aerated and clear. There is no evidence of focal
opacification, pleural effusion or pneumothorax.

The heart is normal in size; the mediastinal contour is within
normal limits. No acute osseous abnormalities are seen. Anterior
bridging osteophytes are seen along the mid to lower thoracic spine.
The patient is status post right-sided rotator cuff repair.
IMPRESSION: No acute cardiopulmonary process seen.

## 2017-12-25 IMAGING — MR MR LUMBAR SPINE W/O CM
4 of 5 series · 19 of 48 positions shown · non-contrast
Comparison: None.

CLINICAL DATA: Pain in lower back and RIGHT buttock radiating to
the lower extremity for 5 months.

EXAM:
MRI LUMBAR SPINE WITHOUT CONTRAST
TECHNIQUE: Multiplanar, multisequence MR imaging of the lumbar spine was
performed. No intravenous contrast was administered.

[Series 6: T2 · sagittal · 4.0mm · 0.73mm/px · 6 of 13 slices shown (1 of 2)]
[im 1/13]
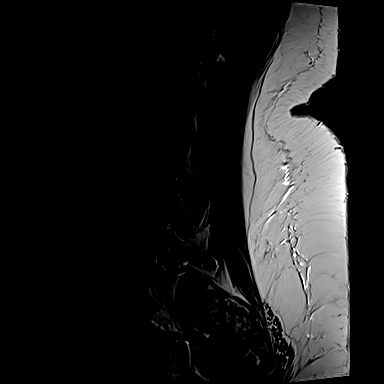
[im 3/13]
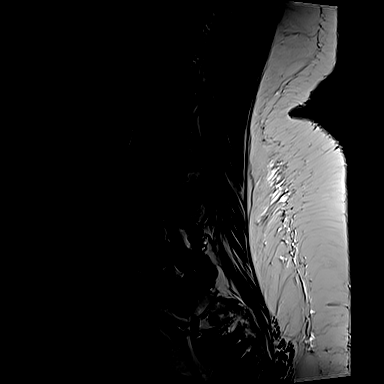
[im 5/13]
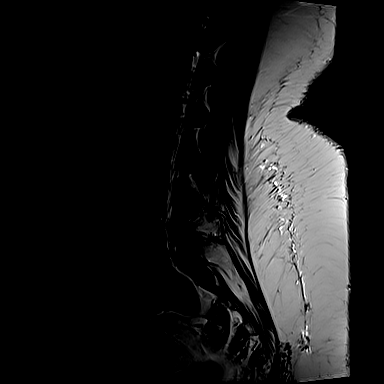
[im 8/13]
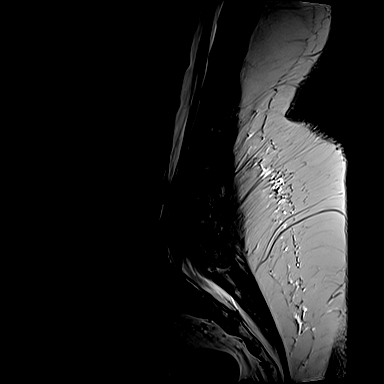
[im 10/13]
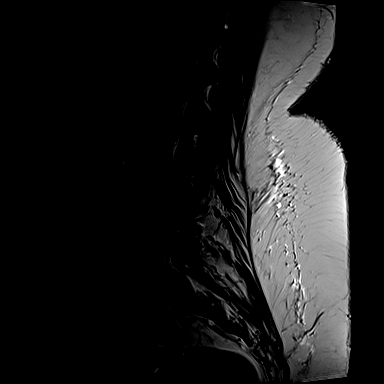
[im 13/13]
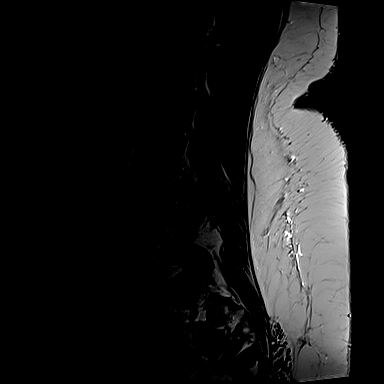

[Series 7: T1 · sagittal · 4.0mm · 0.73mm/px · 3 of 13 slices shown (1 of 2)]
[im 3/13]
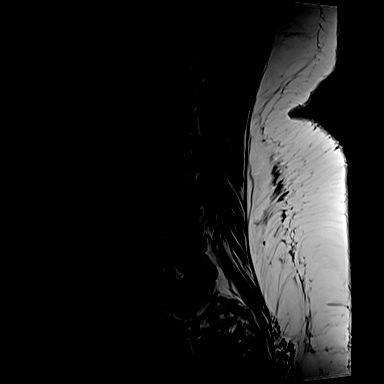
[im 8/13]
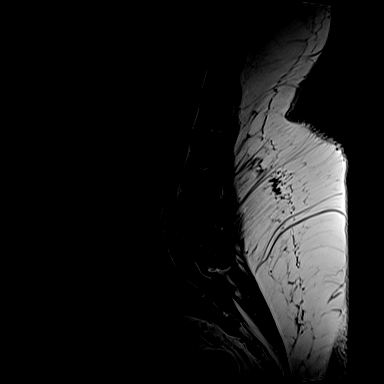
[im 13/13]
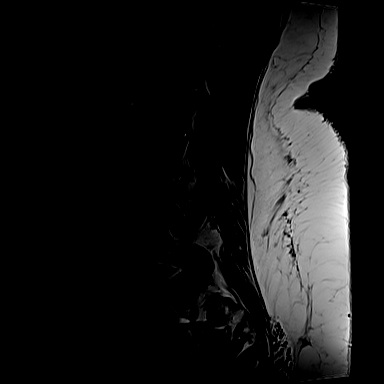

[Series 11: T1 · axial · 4.0mm · 0.28mm/px · z∈[+6,+146]mm · 3 of 35 slices shown (2 of 2)]
[im 5/35]
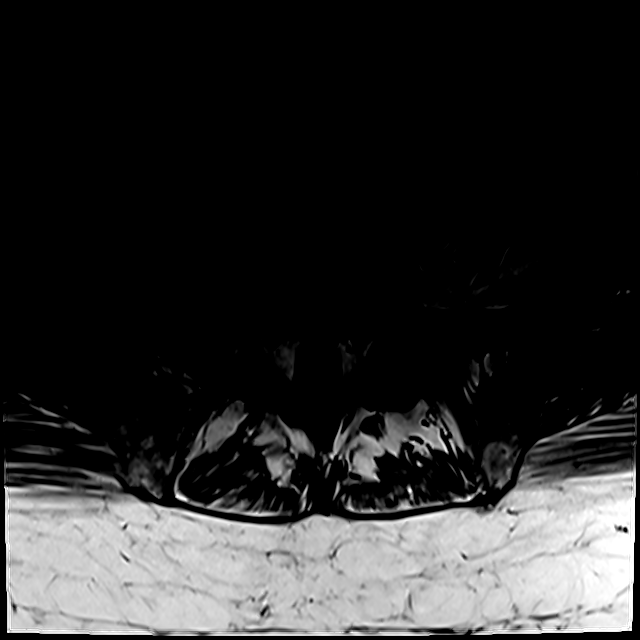
[im 18/35]
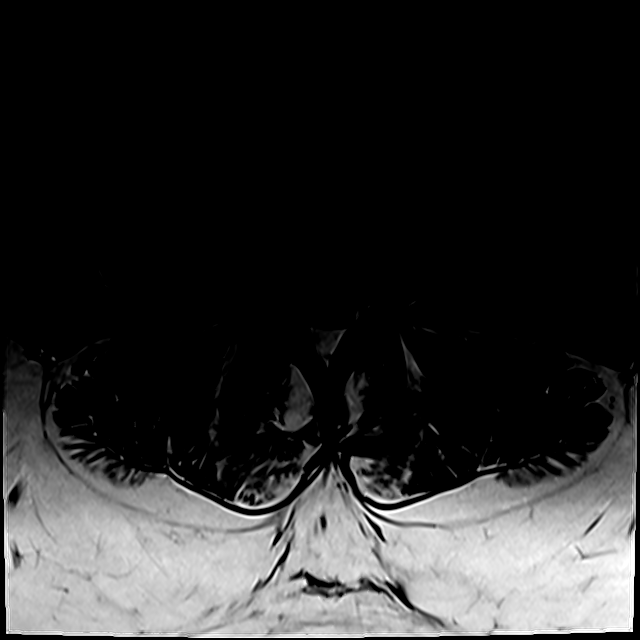
[im 30/35]
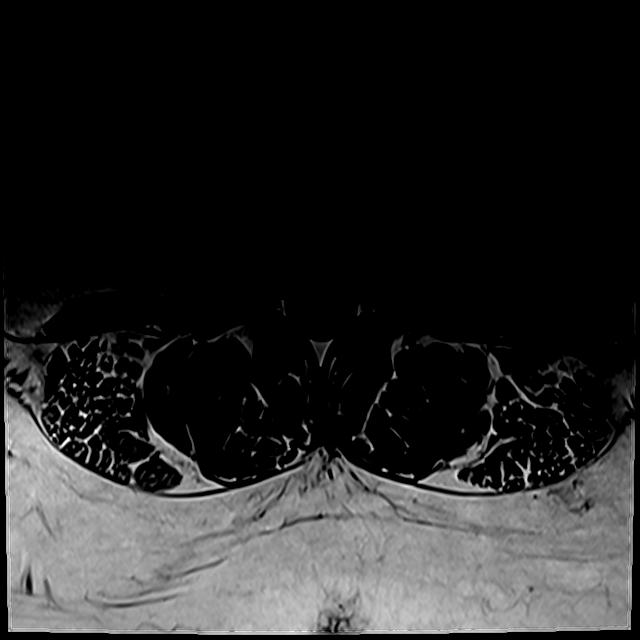

[Series 14: T2 · axial · 4.0mm · 0.28mm/px · z∈[-14,+146]mm · 7 of 35 slices shown (2 of 2)]
[im 1/35]
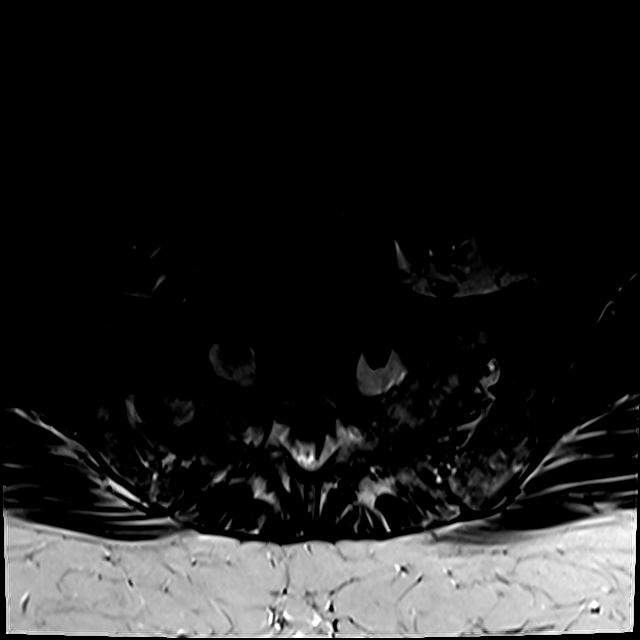
[im 5/35]
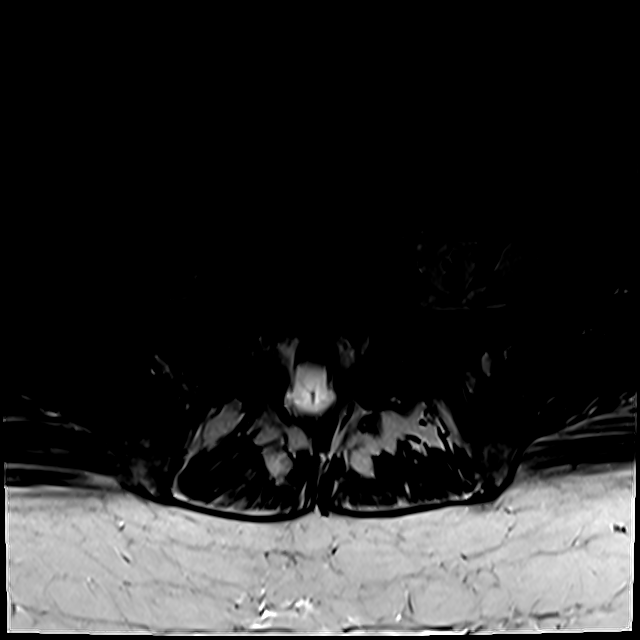
[im 10/35]
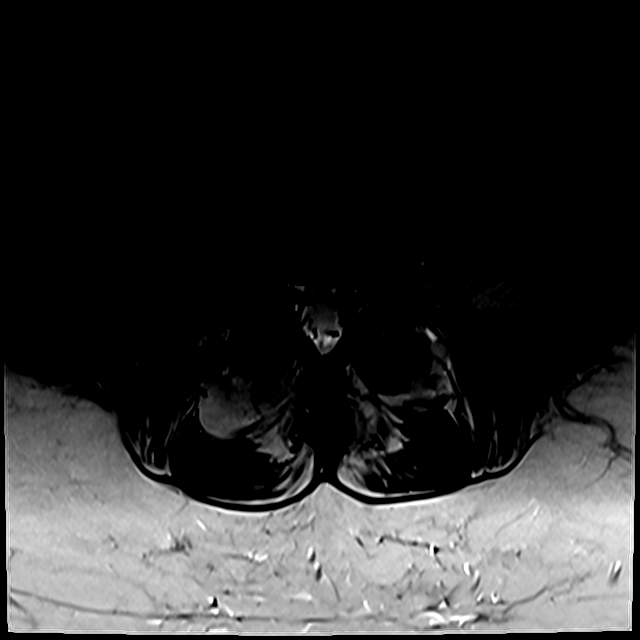
[im 15/35]
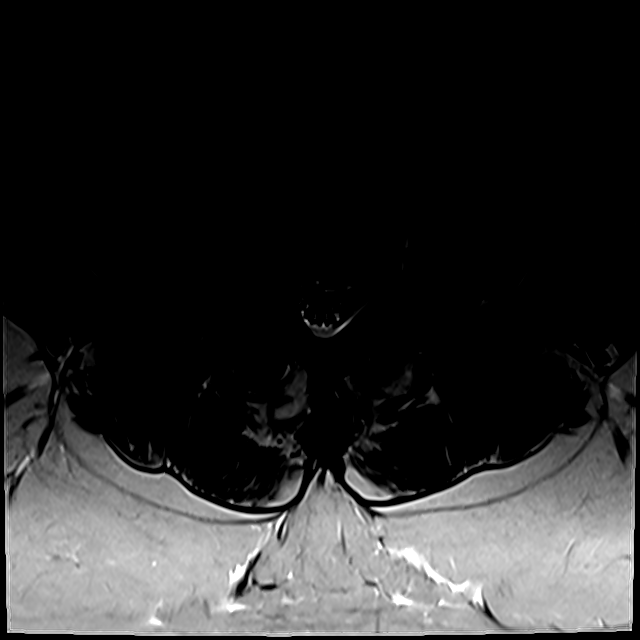
[im 18/35]
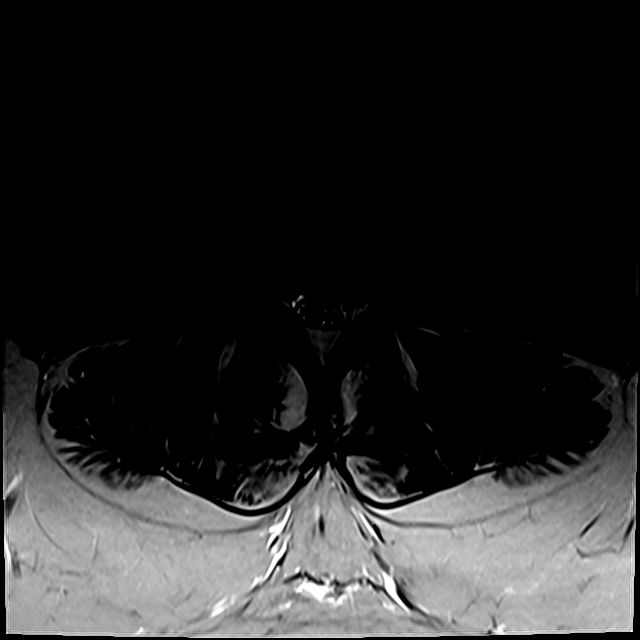
[im 20/35]
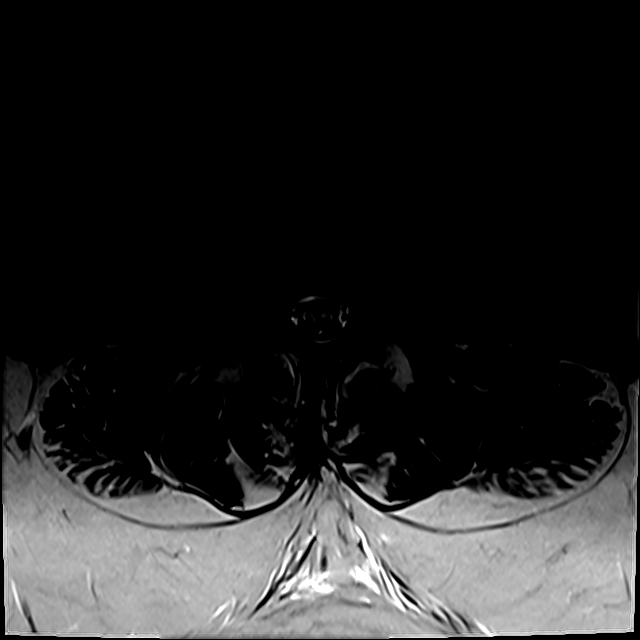
[im 30/35]
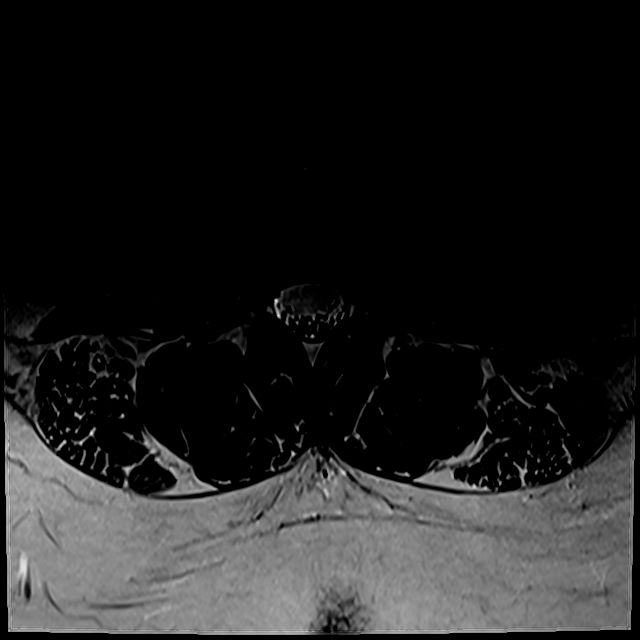

[19 of 48 positions shown; findings below may reference images not displayed]

FINDINGS: Segmentation:  Standard

Alignment:  Physiologic except for trace anterolisthesis L5-S1.

Vertebrae: No worrisome osseous lesion. Atypical hemangioma, LEFT
L2. No compression fracture, marrow replacement lesion, or evidence
of discitis.

Conus medullaris: Extends to the L1 level and appears normal.

Paraspinal and other soft tissues: Unremarkable

Disc levels:

L1-L2:  Normal.

L2-L3:  Normal.

L3-L4: Far lateral and foraminal protrusion on the RIGHT. Posterior
element hypertrophy. No subarticular zone narrowing, but RIGHT L3
nerve root impingement is likely.

L4-L5: Unremarkable disc space. Advanced facet arthropathy. Large
facet joint effusion on the LEFT. No impingement.

L5-S1: Advanced facet arthropathy. Trace anterolisthesis. No
impingement.
IMPRESSION: Far-lateral and foraminal protrusion on the RIGHT at L3-L4.
Correlate clinically for RIGHT L3 nerve root impingement.

Advanced lower lumbar facet arthropathy at L4-5 and L5-S1.

## 2018-02-09 ENCOUNTER — Emergency Department
Admission: EM | Admit: 2018-02-09 | Discharge: 2018-02-09 | Disposition: A | Discharge status: Home / Self Care | Payer: Medicare Other | Attending: Emergency Medicine | Admitting: Emergency Medicine

## 2018-02-09 ENCOUNTER — Other Ambulatory Visit: Payer: Self-pay

## 2018-02-09 ENCOUNTER — Encounter: Payer: Self-pay | Admitting: Emergency Medicine

## 2018-02-09 DIAGNOSIS — R12 Heartburn: Secondary | ICD-10-CM | POA: Diagnosis not present

## 2018-02-09 DIAGNOSIS — M5137 Other intervertebral disc degeneration, lumbosacral region: Secondary | ICD-10-CM | POA: Insufficient documentation

## 2018-02-09 DIAGNOSIS — M545 Low back pain: Secondary | ICD-10-CM | POA: Diagnosis present

## 2018-02-09 DIAGNOSIS — Z96651 Presence of right artificial knee joint: Secondary | ICD-10-CM | POA: Diagnosis not present

## 2018-02-09 DIAGNOSIS — I10 Essential (primary) hypertension: Secondary | ICD-10-CM | POA: Diagnosis not present

## 2018-02-09 DIAGNOSIS — M4807 Spinal stenosis, lumbosacral region: Secondary | ICD-10-CM | POA: Diagnosis not present

## 2018-02-09 DIAGNOSIS — Z79899 Other long term (current) drug therapy: Secondary | ICD-10-CM | POA: Diagnosis not present

## 2018-02-09 DIAGNOSIS — M5417 Radiculopathy, lumbosacral region: Secondary | ICD-10-CM | POA: Insufficient documentation

## 2018-02-09 MED ORDER — KETOROLAC TROMETHAMINE 30 MG/ML IJ SOLN
30.0000 mg | Freq: Once | INTRAMUSCULAR | Status: AC
  Administered 2018-02-09: 30 mg via INTRAMUSCULAR
  Filled 2018-02-09: qty 1

## 2018-02-09 MED ORDER — TRAMADOL HCL 50 MG PO TABS: 50.0000 mg | ORAL_TABLET | Freq: Three times a day (TID) | ORAL | 0 refills | Status: AC | PRN

## 2018-02-09 NOTE — ED Notes (Signed)
Pt states that she took baclofen and tramadol prior to arrival.  Pt states she is also on a prednisone taper.

## 2018-02-09 NOTE — ED Triage Notes (Addendum)
Pt arrived via POV by herself-pt drove herself to ED. Pt states she is being followed [facility] for chronic back pain that radiates to her right knee and the on-call provider told her to come to ED.  Pt states her provider at the clinic said there is nothing else they can prescribe her and the patient needs surgery.  Pt also states that she is going to miss work because of her pain today.  Pt states she needs to see the ortho provider on-call.  Pt took baclofen, tramadol and prednisone for back pain today.

## 2018-02-09 NOTE — ED Notes (Signed)
Pt discharged to home.  Family member driving.  Discharge instructions reviewed.  Verbalized understanding.  No questions or concerns at this time.  Teach back verified.  Pt in NAD.  No items left in ED.   

## 2018-02-09 NOTE — Discharge Instructions (Signed)
Your exams is consistent with your known history of lumbar radiculopathy and spinal stenosis. Consider taking the Tramadol at 3 times a day. Continue your other medicines. Follow-up with Dr. Noel Geroldohen for ongoing symptoms.

## 2018-02-09 NOTE — ED Notes (Signed)
Approx 1 minute after injection of toradol given, pt starts c/o chest pain/heart burn.  EDP notified and gave VO to do an EKG at this time.  Pt later reported that NSAIDs give her heart burn sometimes.

## 2018-02-09 NOTE — ED Notes (Signed)
Pt states that she has been seeing the spine/scoliosis doctor and she called due to increased/uncontrolled pain.  Pt reports that she was given tramadol, but has had no pain relief.  Pt states she was told to come to the ER.  Pt states the only thing that relieves the pain is rest and heat.

## 2018-02-10 ENCOUNTER — Telehealth: Payer: Self-pay | Admitting: Pulmonary Disease

## 2018-02-10 NOTE — Telephone Encounter (Signed)
Pt is aware of below message and voiced her understanding.  I have printed below clearance and faxed it to Dr. Wells Guilesohen's office. Nothing further is needed.

## 2018-02-10 NOTE — Telephone Encounter (Signed)
Can clear from OSA standpoint for surgery. Routine precautions for OSA intraoperatively and minimize pain medications postop. Use CPAP when recovering from anesthesia and in the postoperative period. She can take her machine into the hospital

## 2018-02-10 NOTE — Telephone Encounter (Signed)
Spoke with the Cindy Anthony  She states she is needing to have spine surgery  Dr Noel Geroldohen needs clearance due to her OSA  They have not scheduled it yet but are thinking of doing the surgery around 03/17/18  Do you want her to come in? Please advise thanks!

## 2018-02-11 NOTE — ED Provider Notes (Signed)
[facility] Emergency Department Provider Note ____________________________________________  Time seen: 1255  I have reviewed the triage vital signs and the nursing notes.  HISTORY  Chief Complaint  Back Pain and Knee Pain  HPI Cindy Anthony is a 67 y.o. female presents herself to the [facility] for evaluation of chronic back pain.  Patient is currently under the care of Dr. Noel [facility]old [facility].  She notes that she was told to come to the [facility] for management of her acute pain.  She has a confirmed L3-4 lumbar radiculopathy causing pain to her right lower extremity.  She also has a total right knee arthroplasty 2010.  She is describing increased pain to the low back and left lower extremity.  She denies any recent injury, accident, trauma, or fall.  She is currently taking baclofen and tramadol as well as prednisone taper pack.  She is scheduled to see Dr. Noel Gerold for surgical intervention on April 22.  She reports limited benefit with the tramadol that she is currently taking it and notes that she has 1 day left on her prednisone taper pack.  Past Medical History:  Diagnosis Date  . Anxiety   . Arthritis   . Depression   . Hyperlipidemia   . Hypertension   . Sleep apnea     Patient Active Problem List   Diagnosis Date Noted  . Chronic mesenteric ischemia (HCC) 07/25/2015  . Bacteriuria with pyuria 07/03/2015  . Left flank pain, chronic 06/30/2015  . Essential hypertension 05/12/2015  . Morbid obesity (HCC) 03/03/2015  . Hip pain 03/03/2015  . GERD (gastroesophageal reflux disease) 03/03/2015  . OSA (obstructive sleep apnea) 03/03/2015    Past Surgical History:  Procedure Laterality Date  . ABDOMINAL HYSTERECTOMY    . BARIATRIC SURGERY  2013   sleeve  . SHOULDER ARTHROSCOPY Right 2007  . TOTAL KNEE ARTHROPLASTY Right 2009    Prior to Admission medications   Medication Sig Start Date End Date Taking? Authorizing Provider  LORazepam (ATIVAN) 0.5 MG  tablet Take 0.5 mg by mouth 2 (two) times daily as needed for anxiety.     [provider]  losartan-hydrochlorothiazide (HYZAAR) 50-12.5 MG tablet Take 1 tablet by mouth daily.    [provider]  naproxen (NAPROSYN) 500 MG tablet Take 1 tablet (500 mg total) 2 (two) times daily with a meal by mouth. 10/06/17   Joni Reining, PA-C  omeprazole (PRILOSEC) 40 MG capsule Take 40 mg by mouth daily as needed (acid reflux).     [provider]  traMADol (ULTRAM) 50 MG tablet Take 1 tablet (50 mg total) by mouth 3 (three) times daily as needed for up to 7 days. 02/09/18 02/16/18  Hollee Fate, Charlesetta Ivory, PA-C    Allergies Amlodipine; Lisinopril; and Other  Family History  Problem Relation Age of Onset  . Arthritis Mother   . Hyperlipidemia Mother   . Heart disease Mother   . Stroke Mother   . Hypertension Mother   . Kidney disease Mother   . Diabetes Mother   . Colon polyps Mother   . Heart attack Mother   . Arthritis Father   . Heart disease Father   . Diabetes Paternal Grandmother   . Hyperlipidemia Sister   . Hypertension Brother     Social History Social History   Tobacco Use  . Smoking status: Never Smoker  . Smokeless tobacco: Never Used  Substance Use Topics  . Alcohol use: No    Alcohol/week:  0.0 oz  . Drug use: No    Review of Systems  Constitutional: Negative for fever. Cardiovascular: Negative for chest pain. Respiratory: Negative for shortness of breath. Gastrointestinal: Negative for abdominal pain, vomiting and diarrhea. Genitourinary: Negative for dysuria. Musculoskeletal: Positive for back pain. Skin: Negative for rash. Neurological: Negative for headaches, focal weakness or numbness. ____________________________________________  PHYSICAL EXAM:  VITAL SIGNS: [facility] Triage Vitals  Enc Vitals Group     BP 02/09/18 1048 (!) 165/62     Pulse Rate 02/09/18 1048 68     Resp 02/09/18 1048 20     Temp 02/09/18 1048 98.2 F (36.8 C)      Temp Source 02/09/18 1048 Oral     SpO2 02/09/18 1048 97 %     Weight 02/09/18 1047 265 lb (120.2 kg)     Height 02/09/18 1047 5\' 4"  (1.626 m)     Head Circumference --      Peak Flow --      Pain Score 02/09/18 1045 0     Pain Loc --      Pain Edu? --      Excl. in GC? --     Constitutional: Alert and oriented. Well appearing and in no distress. Head: Normocephalic and atraumatic. Cardiovascular: Normal rate, regular rhythm. Normal distal pulses. Respiratory: Normal respiratory effort. No wheezes/rales/rhonchi. Gastrointestinal: Soft and nontender. No distention. Musculoskeletal: Normal spinal alignment without midline tenderness, spasm, deformity, step-off.  Patient notes pain to the bilateral lumbar sacral junction.  Exam of her right knee is not indicating any effusion or any erythema.  Patient with normal right knee flexion and extension range.  Nontender with normal range of motion in all extremities.  Neurologic:  Mildly antalgic gait without ataxia. Normal speech and language. No gross focal neurologic deficits are appreciated. Skin:  Skin is warm, dry and intact. No rash noted. Psychiatric: Mood and affect are normal. Patient exhibits appropriate insight and judgment. ____________________________________________  PROCEDURES  Procedures Toradol 30 mg IM ____________________________________________  INITIAL IMPRESSION / ASSESSMENT AND PLAN / Anthony COURSE  Patient with chronic low back pain secondary to degenerative disc disease and a lumbar sacral radiculopathy presents today for pain management.  Her exam is overall benign.  Patient reports improvement of her symptoms after IM and injection.  She will be discharged at this time with a prescription for tramadol so that she may increase dosing to 3 times daily.  She will otherwise continue with the baclofen as provided and complete the prednisone taper pack.  Patient will follow up with her primary provider or her orthopedic  surgeon for further evaluation and management.  She verbalized understanding of the treatment plan and is in agreement with not changing her medications at this point.  I reviewed the patient's prescription history over the last 12 months in the multi-state controlled substances database(s) that includes [place], [place], [place], [place], [place], [place], [place], [place], [place], [place], [place], [place], and [place].  Results were notable for previous Tramadol prescription.  ____________________________________________  FINAL CLINICAL IMPRESSION(S) / Anthony DIAGNOSES  Final diagnoses:  DDD (degenerative disc disease), lumbosacral  Spinal stenosis of lumbosacral region  Lumbosacral radiculopathy at L3      Lissa HoardMenshew, Areebah Meinders V Bacon, PA-C 02/11/18 0059    Myrna BlazerSchaevitz, David Matthew, MD 02/11/18 1114

## 2018-07-23 ENCOUNTER — Encounter: Payer: Self-pay | Admitting: *Deleted

## 2018-07-23 ENCOUNTER — Emergency Department
Admission: EM | Admit: 2018-07-23 | Discharge: 2018-07-23 | Disposition: A | Discharge status: Home / Self Care | Payer: Medicare Other | Attending: Emergency Medicine | Admitting: Emergency Medicine

## 2018-07-23 ENCOUNTER — Other Ambulatory Visit: Payer: Self-pay

## 2018-07-23 DIAGNOSIS — Z79899 Other long term (current) drug therapy: Secondary | ICD-10-CM | POA: Diagnosis not present

## 2018-07-23 DIAGNOSIS — Z96651 Presence of right artificial knee joint: Secondary | ICD-10-CM | POA: Insufficient documentation

## 2018-07-23 DIAGNOSIS — K649 Unspecified hemorrhoids: Secondary | ICD-10-CM | POA: Diagnosis not present

## 2018-07-23 DIAGNOSIS — I10 Essential (primary) hypertension: Secondary | ICD-10-CM | POA: Diagnosis not present

## 2018-07-23 DIAGNOSIS — K6289 Other specified diseases of anus and rectum: Secondary | ICD-10-CM | POA: Diagnosis present

## 2018-07-23 MED ORDER — DOCUSATE SODIUM 100 MG PO CAPS: 100.0000 mg | ORAL_CAPSULE | Freq: Every day | ORAL | 2 refills | Status: AC | PRN

## 2018-07-23 MED ORDER — LIDOCAINE 2 % EX GEL: 1.0000 "application " | Freq: Three times a day (TID) | CUTANEOUS | 1 refills | Status: DC | PRN

## 2018-07-23 MED ORDER — LIDOCAINE HCL URETHRAL/MUCOSAL 2 % EX GEL
1.0000 "application " | Freq: Once | CUTANEOUS | Status: AC
  Administered 2018-07-23: 1 via TOPICAL
  Filled 2018-07-23: qty 10

## 2018-07-23 MED ORDER — DOCUSATE SODIUM 100 MG PO CAPS: 100.0000 mg | ORAL_CAPSULE | Freq: Every day | ORAL | 2 refills | Status: DC | PRN

## 2018-07-23 NOTE — ED Provider Notes (Signed)
[facility] Emergency Department Provider Note  ____________________________________________  Time seen: Approximately 8:30 PM  I have reviewed the triage vital signs and the nursing notes.   HISTORY  Chief Complaint Hemorrhoids    HPI Cindy Anthony is a 67 y.o. female who presents the emergency department with a complaint of hemorrhoids.  Patient reports that she has had hemorrhoids past with rectal pain, bright red bleeding.  Patient reports that she has had some mild constipation, had been straining more than normal and is now having some mild diarrhea symptoms.  Patient reports that symptoms began today.  No excessive bleeding.  No history of diverticulitis or diverticulosis, Crohn's, celiac, irritable bowel syndrome, colitis.  Patient denies any abdominal pain.  No fevers or chills, nausea or vomiting.  No urinary symptoms.  Patient's only complaint is rectal pain and bleeding.    Past Medical History:  Diagnosis Date  . Anxiety   . Arthritis   . Depression   . Hyperlipidemia   . Hypertension   . Sleep apnea     Patient Active Problem List   Diagnosis Date Noted  . Chronic mesenteric ischemia (HCC) 07/25/2015  . Bacteriuria with pyuria 07/03/2015  . Left flank pain, chronic 06/30/2015  . Essential hypertension 05/12/2015  . Morbid obesity (HCC) 03/03/2015  . Hip pain 03/03/2015  . GERD (gastroesophageal reflux disease) 03/03/2015  . OSA (obstructive sleep apnea) 03/03/2015    Past Surgical History:  Procedure Laterality Date  . ABDOMINAL HYSTERECTOMY    . BARIATRIC SURGERY  2013   sleeve  . SHOULDER ARTHROSCOPY Right 2007  . TOTAL KNEE ARTHROPLASTY Right 2009    Prior to Admission medications   Medication Sig Start Date End Date Taking? Authorizing Provider  docusate sodium (COLACE) 100 MG capsule Take 1 capsule (100 mg total) by mouth daily as needed. 07/23/18 07/23/19  Janee Ureste, Delorise Royals, PA-C  Lidocaine 2 % GEL Apply 1 application  topically 3 (three) times daily as needed. 07/23/18   Suzzane Quilter, Delorise Royals, PA-C  LORazepam (ATIVAN) 0.5 MG tablet Take 0.5 mg by mouth 2 (two) times daily as needed for anxiety.     [provider]  losartan-hydrochlorothiazide (HYZAAR) 50-12.5 MG tablet Take 1 tablet by mouth daily.    [provider]  naproxen (NAPROSYN) 500 MG tablet Take 1 tablet (500 mg total) 2 (two) times daily with a meal by mouth. 10/06/17   Joni Reining, PA-C  omeprazole (PRILOSEC) 40 MG capsule Take 40 mg by mouth daily as needed (acid reflux).     [provider]    Allergies Amlodipine; Lisinopril; and Other  Family History  Problem Relation Age of Onset  . Arthritis Mother   . Hyperlipidemia Mother   . Heart disease Mother   . Stroke Mother   . Hypertension Mother   . Kidney disease Mother   . Diabetes Mother   . Colon polyps Mother   . Heart attack Mother   . Arthritis Father   . Heart disease Father   . Diabetes Paternal Grandmother   . Hyperlipidemia Sister   . Hypertension Brother     Social History Social History   Tobacco Use  . Smoking status: Never Smoker  . Smokeless tobacco: Never Used  Substance Use Topics  . Alcohol use: No    Alcohol/week: 0.0 standard drinks  . Drug use: No     Review of Systems  Constitutional: No fever/chills Eyes: No visual changes. No discharge ENT: No upper respiratory complaints.  Cardiovascular: no chest pain. Respiratory: no cough. No SOB. Gastrointestinal: No abdominal pain.  No nausea, no vomiting.  Initial constipation, no diarrhea.  Bright red blood when wiping after bowel movement. Genitourinary: Negative for dysuria. No hematuria Musculoskeletal: Negative for musculoskeletal pain. Skin: Negative for rash, abrasions, lacerations, ecchymosis. Neurological: Negative for headaches, focal weakness or numbness. 10-point ROS otherwise negative.  ____________________________________________   PHYSICAL  EXAM:  VITAL SIGNS: ED Triage Vitals  Enc Vitals Group     BP 07/23/18 1933 134/74     Pulse Rate 07/23/18 1933 71     Resp 07/23/18 1933 18     Temp 07/23/18 1933 98.2 F (36.8 C)     Temp Source 07/23/18 1933 Oral     SpO2 07/23/18 1933 97 %     Weight 07/23/18 1934 265 lb (120.2 kg)     Height 07/23/18 1934 5\' 4"  (1.626 m)     Head Circumference --      Peak Flow --      Pain Score 07/23/18 1937 10     Pain Loc --      Pain Edu? --      Excl. in GC? --      Constitutional: Alert and oriented. Well appearing and in no acute distress. Eyes: Conjunctivae are normal. PERRL. EOMI. Head: Atraumatic. ENT:      Ears:       Nose: No congestion/rhinnorhea.      Mouth/Throat: Mucous membranes are moist.  Neck: No stridor.    Cardiovascular: Normal rate, regular rhythm. Normal S1 and S2.  Good peripheral circulation. Respiratory: Normal respiratory effort without tachypnea or retractions. Lungs CTAB. Good air entry to the bases with no decreased or absent breath sounds. Gastrointestinal: Bowel sounds 4 quadrants. Soft and nontender to palpation. No guarding or rigidity. No palpable masses. No distention. No CVA tenderness.  Rectal exam reveals no visible external hemorrhoids, palpation reveals tenderness but no significant palpable hemorrhoids on rectal exam. Musculoskeletal: Full range of motion to all extremities. No gross deformities appreciated. Neurologic:  Normal speech and language. No gross focal neurologic deficits are appreciated.  Skin:  Skin is warm, dry and intact. No rash noted. Psychiatric: Mood and affect are normal. Speech and behavior are normal. Patient exhibits appropriate insight and judgement.   ____________________________________________   LABS (all labs ordered are listed, but only abnormal results are displayed)  Labs Reviewed - No data to  display ____________________________________________  EKG   ____________________________________________  RADIOLOGY   No results found.  ____________________________________________    PROCEDURES  Procedure(s) performed:    Procedures    Medications - No data to display   ____________________________________________   INITIAL IMPRESSION / ASSESSMENT AND PLAN / ED COURSE  Pertinent labs & imaging results that were available during my care of the patient were reviewed by me and considered in my medical decision making (see chart for details).  Review of the [place] CSRS was performed in accordance of the [facility] prior to dispensing any controlled drugs.      Patient's diagnosis is consistent with hemorrhoids.  Patient presents emergency department with rectal pain, blood on tissue paper.  Patient had some mild constipation, with straining for bowel movement.  Now has mild diarrhea.  Differential included GI bleeding, diverticulitis, diverticulosis, rectal trauma, hemorrhoids.  Findings and history are consistent with hemorrhoids.  No external thrombosed hemorrhoids are visualized.  No indication for urgent referral to surgery.  No indication for labs or imaging at this time.. Patient will  be discharged home with prescriptions for stool softener, lidocaine gel for symptom relief. Patient is to follow up with surgery as needed or otherwise directed. Patient is given ED precautions to return to the ED for any worsening or new symptoms.     ____________________________________________  FINAL CLINICAL IMPRESSION(S) / ED DIAGNOSES  Final diagnoses:  Hemorrhoids, unspecified hemorrhoid type      NEW MEDICATIONS STARTED DURING THIS VISIT:  ED Discharge Orders         Ordered    Lidocaine 2 % GEL  3 times daily PRN     07/23/18 2045    docusate sodium (COLACE) 100 MG capsule  Daily PRN,   Status:  Discontinued     07/23/18 2045    docusate sodium (COLACE) 100 MG capsule   Daily PRN     07/23/18 2046              This chart was dictated using voice recognition software/Dragon. Despite best efforts to proofread, errors can occur which can change the meaning. Any change was purely unintentional.    Racheal PatchesCuthriell, Aundrea Horace D, PA-C 07/23/18 2046    Phineas SemenGoodman, Gray[place], MD 07/23/18 2114

## 2018-07-23 NOTE — ED Triage Notes (Addendum)
Pt to ED reporting rectal pain with a hx of hemorrhoids. Bright red bleeding that started today when wiping. No surgical intervention needed for hemorrhoids in the past. Normal BM yesterday.

## 2018-07-23 NOTE — ED Notes (Signed)
Patient requesting dose of lidocaine tonight prior to d/c, PA made aware of the same

## 2018-08-07 ENCOUNTER — Encounter: Payer: Self-pay | Admitting: Surgery

## 2018-08-07 ENCOUNTER — Ambulatory Visit (INDEPENDENT_AMBULATORY_CARE_PROVIDER_SITE_OTHER): Payer: Medicare Other | Admitting: Surgery

## 2018-08-07 VITALS — BP 122/80 | HR 74 | Ht 68.0 in | Wt 268.0 lb

## 2018-08-07 DIAGNOSIS — K625 Hemorrhage of anus and rectum: Secondary | ICD-10-CM

## 2018-08-07 NOTE — Patient Instructions (Addendum)
Please take Colace daily. Please drink plenty of water daily as well. Increase fiber to your diet. Please see follow up appointment listed below.  We have sent the referral to Bariatrics.  Someone should contact you within 7 days to schedule an appointment. If you do not hear from anyone please let us know so we can check on this for you.      Fiber Content in Foods See the following list for the dietary fiber content of some common foods. High-fiber foods High-fiber foods contain 4 grams or more (4g or more) of fiber per serving. They include:  Artichoke (fresh) - 1 medium has 10.3g of fiber.  Baked beans, plain or vegetarian (canned) -  cup has 5.2g of fiber.  Blackberries or raspberries (fresh) -  cup has 4g of fiber.  Bran cereal -  cup has 8.6g of fiber.  Bulgur (cooked) -  cup has 4g of fiber.  Kidney beans (canned) -  cup has 6.8g of fiber.  Lentils (cooked) -  cup has 7.8g of fiber.  Pear (fresh) - 1 medium has 5.1g of fiber.  Peas (frozen) -  cup has 4.4g of fiber.  Pinto beans (canned) -  cup has 5.5g of fiber.  Pinto beans (dried and cooked) -  cup has 7.7g of fiber.  Potato with skin (baked) - 1 medium has 4.4g of fiber.  Quinoa (cooked) -  cup has 5g of fiber.  Soybeans (canned, frozen, or fresh) -  cup has 5.1g of fiber.  Moderate-fiber foods Moderate-fiber foods contain 1-4 grams (1-4g) of fiber per serving. They include:  Almonds - 1 oz. has 3.5g of fiber.  Apple with skin - 1 medium has 3.3g of fiber.  Applesauce, sweetened -  cup has 1.5g of fiber.  Bagel, plain - one 4-inch (10-cm) bagel has 2g of fiber.  Banana - 1 medium has 3.1g of fiber.  Broccoli (cooked) -  cup has 2.5g of fiber.  Carrots (cooked) -  cup has 2.3g of fiber.  Corn (canned or frozen) -  cup has 2.1g of fiber.  Corn tortilla - one 6-inch (15-cm) tortilla has 1.5g of fiber.  Green beans (canned) -  cup has 2g of fiber.  Instant oatmeal -  cup has  about 2g of fiber.  Long-grain brown rice (cooked) - 1 cup has 3.5g of fiber.  Macaroni, enriched (cooked) - 1 cup has 2.5g of fiber.  Melon - 1 cup has 1.4g of fiber.  Multigrain cereal -  cup has about 2-4g of fiber.  Orange - 1 small has 3.1g of fiber.  Potatoes, mashed -  cup has 1.6g of fiber.  Raisins - 1/4 cup has 1.6g of fiber.  Squash -  cup has 2.9g of fiber.  Sunflower seeds -  cup has 1.1g of fiber.  Tomato - 1 medium has 1.5g of fiber.  Vegetable or soy patty - 1 has 3.4g of fiber.  Whole-wheat bread - 1 slice has 2g of fiber.  Whole-wheat spaghetti -  cup has 3.2g of fiber.  Low-fiber foods Low-fiber foods contain less than 1 gram (less than 1g) of fiber per serving. They include:  Egg - 1 large.  Flour tortilla - one 6-inch (15-cm) tortilla.  Fruit juice -  cup.  Lettuce - 1 cup.  Meat, poultry, or fish - 1 oz.  Milk - 1 cup.  Spinach (raw) - 1 cup.  White bread - 1 slice.  White rice -  cup.  Yogurt -  cup.  Actual amounts of fiber in foods may be different depending on processing. Talk with your dietitian about how much fiber you need in your diet. This information is not intended to replace advice given to you by your health care provider. Make sure you discuss any questions you have with your health care provider. Document Released: 03/31/2007 Document Revised: 04/19/2016 Document Reviewed: 01/05/2016 Elsevier Interactive Patient Education  2018 [employer]    Hemorrhoids Hemorrhoids are swollen veins in and around the rectum or anus. Hemorrhoids can cause pain, itching, or bleeding. Most of the time, they do not cause serious problems. They usually get better with diet changes, lifestyle changes, and other home treatments. Follow these instructions at home: Eating and drinking  Eat foods that have fiber, such as whole grains, beans, nuts, fruits, and vegetables. Ask your doctor about taking products that have added fiber  (fibersupplements).  Drink enough fluid to keep your pee (urine) clear or pale yellow. For Pain and Swelling  Take a warm-water bath (sitz bath) for 20 minutes to ease pain. Do this 3-4 times a day.  If directed, put ice on the painful area. It may be helpful to use ice between your warm baths. ? Put ice in a plastic bag. ? Place a towel between your skin and the bag. ? Leave the ice on for 20 minutes, 2-3 times a day. General instructions  Take over-the-counter and prescription medicines only as told by your doctor. ? Medicated creams and medicines that are inserted into the anus (suppositories) may be used or applied as told.  Exercise often.  Go to the bathroom when you have the urge to poop (to have a bowel movement). Do not wait.  Avoid pushing too hard (straining) when you poop.  Keep the butt area dry and clean. Use wet toilet paper or moist paper towels.  Do not sit on the toilet for a long time. Contact a doctor if:  You have any of these: ? Pain and swelling that do not get better with treatment or medicine. ? Bleeding that will not stop. ? Trouble pooping or you cannot poop. ? Pain or swelling outside the area of the hemorrhoids. This information is not intended to replace advice given to you by your health care provider. Make sure you discuss any questions you have with your health care provider. Document Released: 08/21/2008 Document Revised: 04/19/2016 Document Reviewed: 07/27/2015 Elsevier Interactive Patient Education  [employer]2018 [employer]vier Inc.

## 2018-08-11 NOTE — Progress Notes (Signed)
Surgical Clinic History and Physical  Referring provider:  Orene Desanctis, MD [address] [place], [place] [address]  HISTORY OF PRESENT ILLNESS (HPI):  67 y.o. female presents for evaluation of hemorrhoids. Patient reports she 2 weeks ago experienced some transient constipation with straining for BM's, followed by loose BM's and a small amount of blood on toilet paper. She presented to ED at that time due to concern regarding blood per rectum in the context of a family history of colon cancer, and she says she was advised to follow up with a surgeon for her hemorrhoids. Review of patient's chart, however, indicates that no hemorrhoids were appreciated on her ED exam, and surgical follow-up was advised as needed. Patient now reports her symptoms have since resolved. Of note, patient was due for high-risk screening colonoscopy with Dr. Mechele Collin this summer (last saw 02/2018), but deferred in anticipation of lumbar spine fusion, which she cancelled, and she has not yet scheduled GI follow-up. Patient also notes she previously underwent sleeve gastrectomy (2010), but lost very little weight and has since remained unable to loose any further weight despite several attempts to do so by various diets. Patient otherwise denies any abdominal pain, N/V, fever/chills, CP, or SOB.  PAST MEDICAL HISTORY (PMH):  Past Medical History:  Diagnosis Date  . Anxiety   . Arthritis   . Depression   . Hyperlipidemia   . Hypertension   . Sleep apnea      PAST SURGICAL HISTORY [facility]):  Past Surgical History:  Procedure Laterality Date  . ABDOMINAL HYSTERECTOMY    . BARIATRIC SURGERY  2013   sleeve  . SHOULDER ARTHROSCOPY Right 2007  . TOTAL KNEE ARTHROPLASTY Right 2009     MEDICATIONS:  Prior to Admission medications   Medication Sig Start Date End Date Taking? Authorizing Provider  celecoxib (CELEBREX) 100 MG capsule Take 100 mg by mouth 2 (two) times daily.   Yes [provider]  docusate sodium  (COLACE) 100 MG capsule Take 1 capsule (100 mg total) by mouth daily as needed. 07/23/18 07/23/19 Yes Cuthriell, Delorise Royals, PA-C  Lidocaine 2 % GEL Apply 1 application topically 3 (three) times daily as needed. 07/23/18  Yes Cuthriell, Delorise Royals, PA-C  losartan-hydrochlorothiazide (HYZAAR) 50-12.5 MG tablet Take 1 tablet by mouth daily.   Yes [provider]  naproxen (NAPROSYN) 500 MG tablet Take 1 tablet (500 mg total) 2 (two) times daily with a meal by mouth. 10/06/17  Yes Joni Reining, PA-C  omeprazole (PRILOSEC) 40 MG capsule Take 40 mg by mouth daily as needed (acid reflux).    Yes [provider]  traMADol (ULTRAM) 50 MG tablet Take by mouth as needed.   Yes [provider]     ALLERGIES:  Allergies  Allergen Reactions  . Amlodipine Diarrhea  . Lisinopril Other (See Comments)    Dizziness  . Other Other (See Comments)     SOCIAL HISTORY:  Social History   Socioeconomic History  . Marital status: Divorced    Spouse name: Not on file  . Number of children: 3  . Years of education: Not on file  . Highest education level: Not on file  Occupational History  . Occupation: Education officer, environmental  Social Needs  . Financial resource strain: Not on file  . Food insecurity:    Worry: Not on file    Inability: Not on file  . Transportation needs:    Medical: Not on file    Non-medical: Not on file  Tobacco Use  . Smoking status: Never Smoker  . Smokeless tobacco: Never Used  Substance and Sexual Activity  . Alcohol use: No    Alcohol/week: 0.0 standard drinks  . Drug use: No  . Sexual activity: Not on file  Lifestyle  . Physical activity:    Days per week: Not on file    Minutes per session: Not on file  . Stress: Not on file  Relationships  . Social connections:    Talks on phone: Not on file    Gets together: Not on file    Attends religious service: Not on file    Active member of club or organization: Not on file    Attends meetings of  clubs or organizations: Not on file    Relationship status: Not on file  . Intimate partner violence:    Fear of current or ex partner: Not on file    Emotionally abused: Not on file    Physically abused: Not on file    Forced sexual activity: Not on file  Other Topics Concern  . Not on file  Social History Narrative  . Not on file    The patient currently resides (home / rehab facility / nursing home): Home The patient normally is (ambulatory / bedbound): Ambulatory  FAMILY HISTORY:  Family History  Problem Relation Age of Onset  . Arthritis Mother   . Hyperlipidemia Mother   . Heart disease Mother   . Stroke Mother   . Hypertension Mother   . Kidney disease Mother   . Diabetes Mother   . Colon polyps Mother   . Heart attack Mother   . Arthritis Father   . Heart disease Father   . Diabetes Paternal Grandmother   . Hyperlipidemia Sister   . Hypertension Brother     Otherwise negative/non-contributory.  REVIEW OF SYSTEMS:  Constitutional: denies any other weight loss, fever, chills, or sweats  Eyes: denies any other vision changes, history of eye injury  ENT: denies sore throat, hearing problems  Respiratory: denies shortness of breath, wheezing  Cardiovascular: denies chest pain, palpitations  Gastrointestinal: abdominal pain, N/V, and bowel function as per HPI Musculoskeletal: denies any other joint pains or cramps  Skin: Denies any other rashes or skin discolorations Neurological: denies any other headache, dizziness, weakness  Psychiatric: Denies any other depression, anxiety   All other review of systems were otherwise negative   VITAL SIGNS:  BP 122/80   Pulse 74   Ht 5\' 8"  (1.727 m)   Wt 268 lb (121.6 kg)   BMI 40.75 kg/m   PHYSICAL EXAM:  Constitutional:  -- Obese body habitus  -- Awake, alert, and oriented x3  Eyes:  -- Pupils equally round and reactive to light  -- No scleral icterus  Ear, nose, throat:  -- No jugular venous distension -- No  nasal drainage, bleeding Pulmonary:  -- No crackles  -- Equal breath sounds bilaterally -- Breathing non-labored at rest Cardiovascular:  -- S1, S2 present  -- No pericardial rubs  Gastrointestinal:  -- Abdomen soft, nontender, non-distended, no guarding/rebound  -- No abdominal masses appreciated, pulsatile or otherwise  Anorectal: -- No evidence of prolapsed hemorrhoids or fissure -- Anal sphincter tone WNL, no gross blood appreciated Musculoskeletal and Integumentary:  -- Wounds or skin discoloration: None appreciated -- Extremities: B/L UE and LE FROM, hands and feet warm, no edema  Neurologic:  -- Motor function: Intact and symmetric -- Sensation: Intact and symmetric  Labs:  CBC Latest  Ref Rng & Units 03/01/2017 01/11/2017 06/30/2015  WBC 3.6 - 11.0 K/uL 9.6 7.6 8.8  Hemoglobin 12.0 - 16.0 g/dL 57.814.7 46.915.2 15.3(H)  Hematocrit 35.0 - 47.0 % 43.3 45.6 46.0  Platelets 150 - 440 K/uL 237 219 241.0   CMP Latest Ref Rng & Units 03/01/2017 01/11/2017 06/30/2015  Glucose 65 - 99 mg/dL 629(B101(H) 96 79  BUN 6 - 20 mg/dL 17 19 21   Creatinine 0.44 - 1.00 mg/dL 2.840.84 1.320.99 4.400.84  Sodium 135 - 145 mmol/L 139 139 140  Potassium 3.5 - 5.1 mmol/L 4.1 3.7 4.1  Chloride 101 - 111 mmol/L 105 106 105  CO2 22 - 32 mmol/L 28 25 27   Calcium 8.9 - 10.3 mg/dL 9.4 9.3 9.4  Total Protein 6.0 - 8.3 g/dL - - 7.4  Total Bilirubin 0.2 - 1.2 mg/dL - - 0.6  Alkaline Phos 39 - 117 U/L - - 104  AST 0 - 37 U/L - - 16  ALT 0 - 35 U/L - - 17   Imaging studies: No new pertinent imaging studies available for review   Assessment/Plan:  67 y.o. female with brief transient constipation followed by likewise transient diarrhea with small amount of blood per rectum without visible or palpable evidence of prolapsed hemorrhoids in context of family history of colon cancer, complicated by co-morbidities including morbid obesity (BMI 41) despite sleeve gastrectomy (2010), chronic lower back pain and osteoarthritis, HTN, HLD, OSA,  generalized anxiety disorder, and major depression disorder.   - ensure hydration with high-fiber heart healthy diet and once daily colace (may take up to twice daily, reduce/stop if loose BM's) and Miralax or magnesium citrate as needed   - offered to assist making GI follow-up appointment, but patient says she'd prefer to call and schedule   - discussed weight loss including consideration for bariatric program referral for possible conversion of sleeve gastrectomy to roux-en-Y gastric bypass, for which patient expresses interest, stating she'd prefer to loose weight than proceed with spinal fusion  - printed bariatric surgery contact and seminars information provided as per patient request  - return to clinic as needed, instructed to call if any questions or concerns  All of the above recommendations were discussed with the patient, and all of patient's questions were answered to her expressed satisfaction.  Thank you for the opportunity to participate in this patient's care.  -- Scherrie GerlachJason E. Earlene Plateravis, MD, RPVI [place]: [facility] General Surgery - Partnering for exceptional care. Office: [phone]

## 2018-08-12 ENCOUNTER — Encounter: Payer: Self-pay | Admitting: Surgery

## 2018-08-22 ENCOUNTER — Ambulatory Visit: Payer: Medicare Other | Admitting: Pulmonary Disease

## 2018-08-29 ENCOUNTER — Encounter: Payer: Self-pay | Admitting: Pulmonary Disease

## 2018-08-29 ENCOUNTER — Ambulatory Visit (INDEPENDENT_AMBULATORY_CARE_PROVIDER_SITE_OTHER): Payer: Medicare Other | Admitting: Pulmonary Disease

## 2018-08-29 DIAGNOSIS — G4733 Obstructive sleep apnea (adult) (pediatric): Secondary | ICD-10-CM | POA: Diagnosis not present

## 2018-08-29 NOTE — Assessment & Plan Note (Signed)
CPAP is working well on current settings.  She is compliant and this is certainly helping her daytime somnolence and fatigue  Weight loss encouraged, compliance with goal of at least 4-6 hrs every night is the expectation. Advised against medications with sedative side effects Cautioned against driving when sleepy - understanding that sleepiness will vary on a day to day basis

## 2018-08-29 NOTE — Progress Notes (Signed)
   Subjective:    Patient ID: Cindy Anthony, female    DOB: 01-27-1951, 67 y.o.   MRN: [id number]  HPI  66 yo for FU of obstructive sleep apnea. She was diagnosed with OSA in 2008, maintained on CPAP 7 cm with a small nasal mask.  She underwent gastric bypass in 2010 , her peak weight was 288 , now at 265  She is compliant with her machine, this is helping.  She denies daytime somnolence or fatigue.  No dryness.  No problems with mask or pressure Blood pressure is well controlled on losartan CPAP download was reviewed which shows excellent control of events on 7 cm, good compliance 7.5 hours every night and minimal leak on nasal pillows   Review of Systems Patient denies significant dyspnea,cough, hemoptysis,  chest pain, palpitations, pedal edema, orthopnea, paroxysmal nocturnal dyspnea, lightheadedness, nausea, vomiting, abdominal or  leg pains      Objective:   Physical Exam   Gen. Pleasant, obese, in no distress ENT - no lesions, no post nasal drip Neck: No JVD, no thyromegaly, no carotid bruits Lungs: no use of accessory muscles, no dullness to percussion, decreased without rales or rhonchi  Cardiovascular: Rhythm regular, heart sounds  normal, no murmurs or gallops, no peripheral edema Musculoskeletal: No deformities, no cyanosis or clubbing , no tremors         Assessment & Plan:

## 2018-08-29 NOTE — Patient Instructions (Signed)
Flu shot. CPAP is working well 

## 2018-09-11 ENCOUNTER — Ambulatory Visit: Payer: Self-pay | Admitting: Surgery

## 2018-09-19 IMAGING — CR DG SHOULDER 2+V*R*
1 series · 3 of 3 positions shown · non-contrast
Comparison: None.

CLINICAL DATA: 66 y/o F; 1 month of right shoulder pain
progressively getting worse.

EXAM:
RIGHT SHOULDER - 2+ VIEW

[Series 1: dg shoulder right · 0.14mm/px · 3 of 3 slices shown]
[im 1/3]
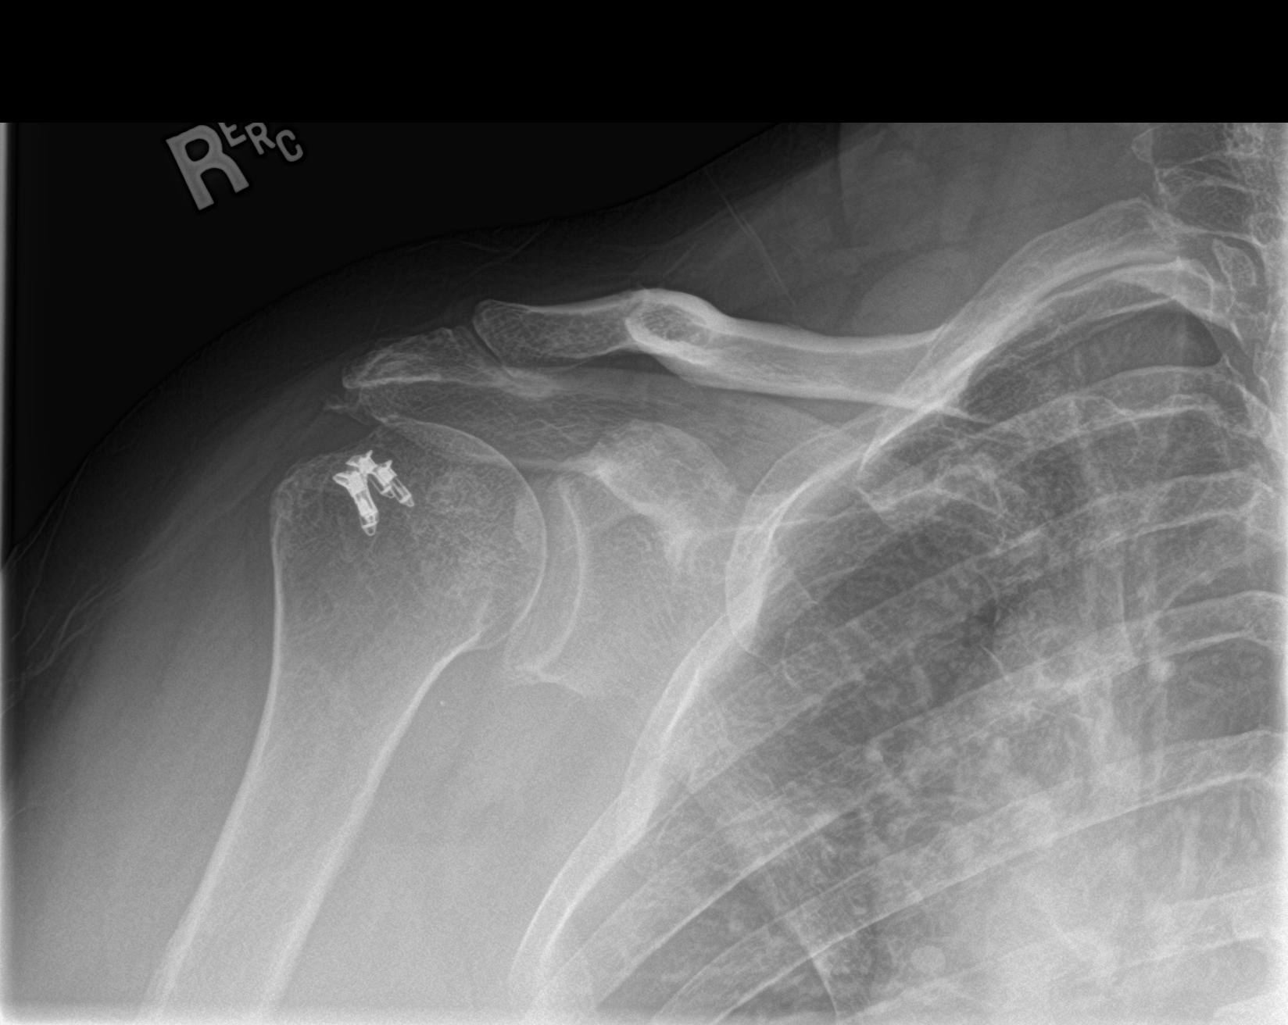
[im 2/3]
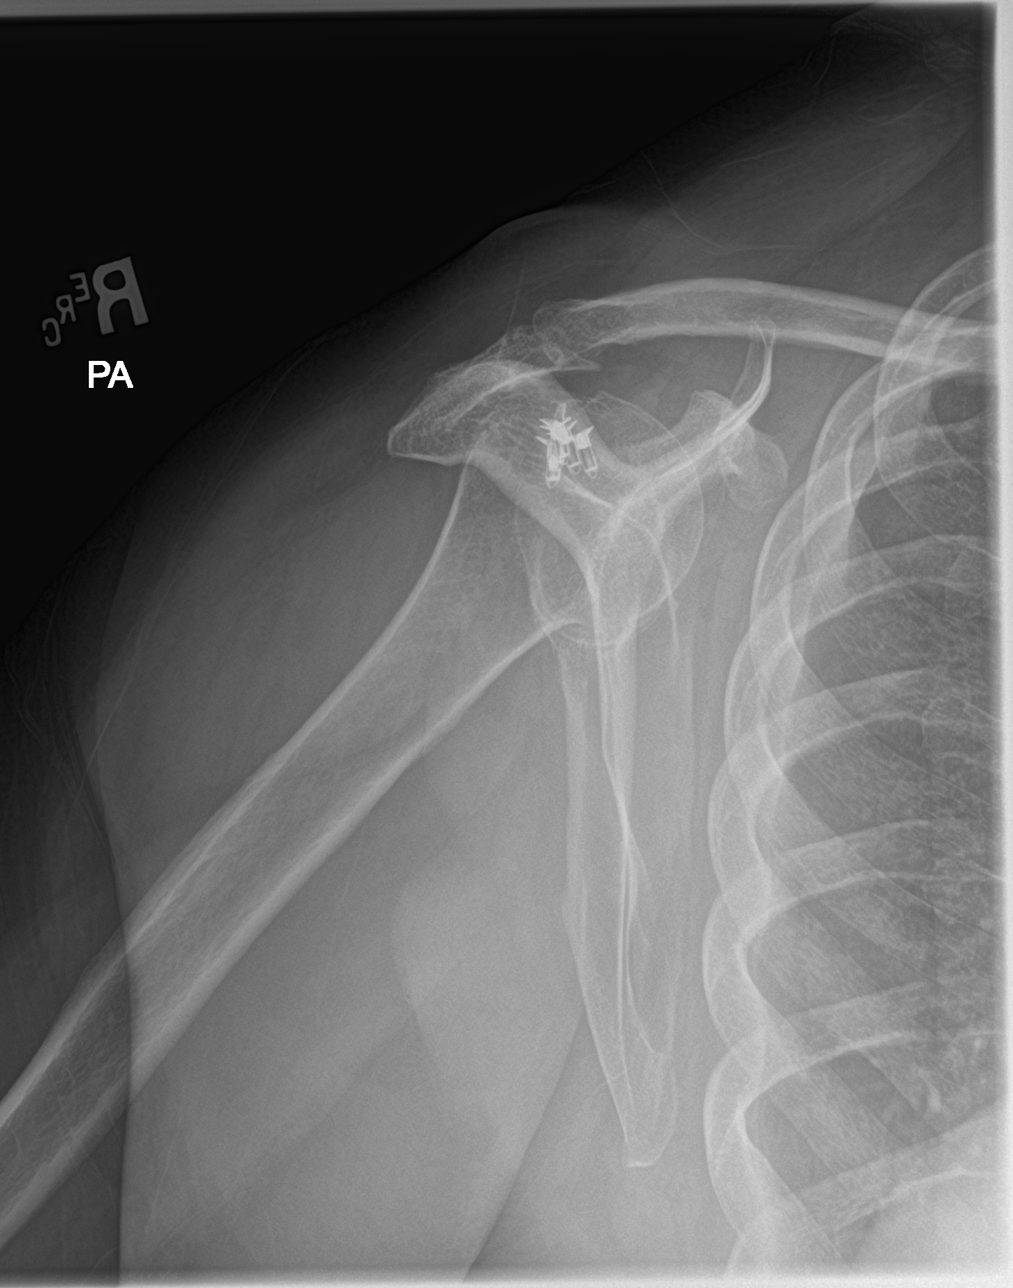
[im 3/3]
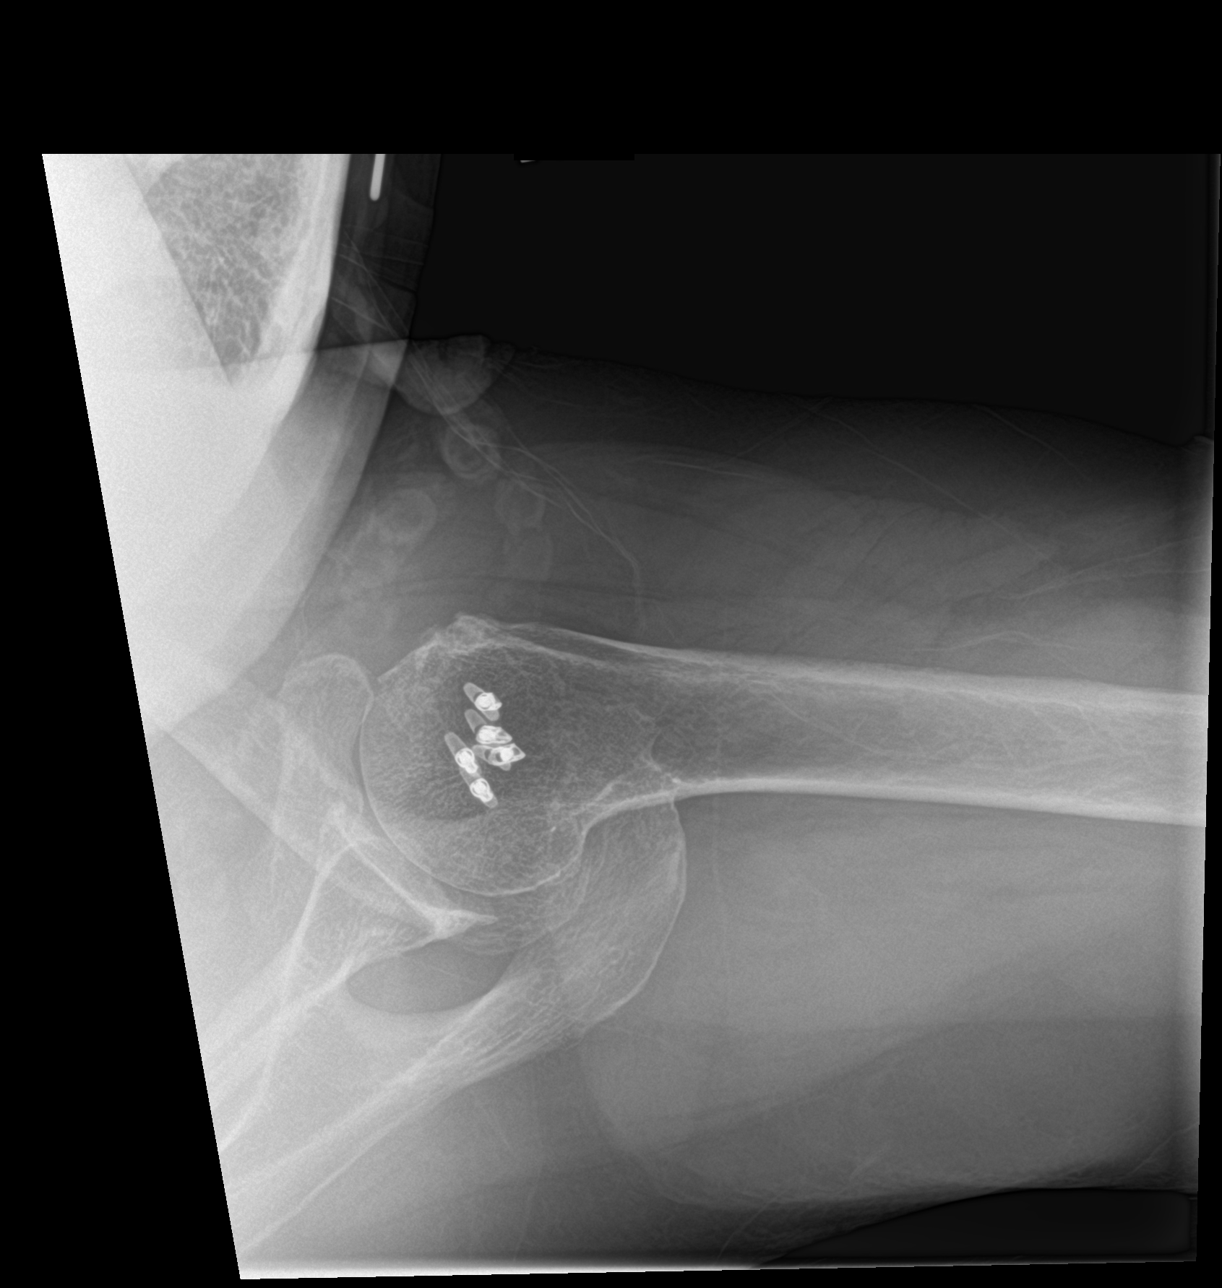

[3 of 3 positions shown; findings below may reference images not displayed]

FINDINGS: No acute fracture or dislocation identified. Postsurgical changes
related to rotator cuff repair. Subacromial enthesophyte. Mild
irregularity of the inferior glenoid rim may represent sequelae of
old trauma.
IMPRESSION: 1. No acute fracture or dislocation.
2. Subacromial enthesophyte. Postsurgical changes of rotator cuff
repair.

By: Dimitri Naqvi M.D.

## 2018-11-27 ENCOUNTER — Encounter: Payer: Self-pay | Admitting: *Deleted

## 2018-11-28 ENCOUNTER — Encounter: Payer: Self-pay | Admitting: Certified Registered Nurse Anesthetist

## 2018-11-28 ENCOUNTER — Ambulatory Visit: Payer: Medicare Other | Admitting: Certified Registered Nurse Anesthetist

## 2018-11-28 ENCOUNTER — Other Ambulatory Visit: Payer: Self-pay

## 2018-11-28 ENCOUNTER — Ambulatory Visit
Admission: RE | Admit: 2018-11-28 | Discharge: 2018-11-28 | Disposition: A | Discharge status: Home / Self Care | Payer: Medicare Other | Attending: Unknown Physician Specialty | Admitting: Unknown Physician Specialty

## 2018-11-28 ENCOUNTER — Encounter
Admission: RE | Disposition: A | Discharge status: Home / Self Care | Payer: Self-pay | Source: Home / Self Care | Attending: Unknown Physician Specialty

## 2018-11-28 DIAGNOSIS — M1990 Unspecified osteoarthritis, unspecified site: Secondary | ICD-10-CM | POA: Insufficient documentation

## 2018-11-28 DIAGNOSIS — Z791 Long term (current) use of non-steroidal anti-inflammatories (NSAID): Secondary | ICD-10-CM | POA: Diagnosis not present

## 2018-11-28 DIAGNOSIS — K219 Gastro-esophageal reflux disease without esophagitis: Secondary | ICD-10-CM | POA: Diagnosis not present

## 2018-11-28 DIAGNOSIS — R12 Heartburn: Secondary | ICD-10-CM | POA: Insufficient documentation

## 2018-11-28 DIAGNOSIS — K640 First degree hemorrhoids: Secondary | ICD-10-CM | POA: Insufficient documentation

## 2018-11-28 DIAGNOSIS — F419 Anxiety disorder, unspecified: Secondary | ICD-10-CM | POA: Insufficient documentation

## 2018-11-28 DIAGNOSIS — I10 Essential (primary) hypertension: Secondary | ICD-10-CM | POA: Diagnosis not present

## 2018-11-28 DIAGNOSIS — K5730 Diverticulosis of large intestine without perforation or abscess without bleeding: Secondary | ICD-10-CM | POA: Insufficient documentation

## 2018-11-28 DIAGNOSIS — Z903 Acquired absence of stomach [part of]: Secondary | ICD-10-CM | POA: Diagnosis not present

## 2018-11-28 DIAGNOSIS — G4730 Sleep apnea, unspecified: Secondary | ICD-10-CM | POA: Insufficient documentation

## 2018-11-28 DIAGNOSIS — K449 Diaphragmatic hernia without obstruction or gangrene: Secondary | ICD-10-CM | POA: Insufficient documentation

## 2018-11-28 DIAGNOSIS — Z9884 Bariatric surgery status: Secondary | ICD-10-CM | POA: Insufficient documentation

## 2018-11-28 DIAGNOSIS — Z79899 Other long term (current) drug therapy: Secondary | ICD-10-CM | POA: Diagnosis not present

## 2018-11-28 DIAGNOSIS — Z1211 Encounter for screening for malignant neoplasm of colon: Secondary | ICD-10-CM | POA: Diagnosis not present

## 2018-11-28 DIAGNOSIS — Z96651 Presence of right artificial knee joint: Secondary | ICD-10-CM | POA: Diagnosis not present

## 2018-11-28 DIAGNOSIS — Z800 Family history of malignant neoplasm of digestive organs: Secondary | ICD-10-CM | POA: Insufficient documentation

## 2018-11-28 HISTORY — DX: Unspecified hemorrhoids: K64.9

## 2018-11-28 HISTORY — DX: Gastro-esophageal reflux disease without esophagitis: K21.9

## 2018-11-28 HISTORY — PX: COLONOSCOPY WITH PROPOFOL: SHX5780

## 2018-11-28 HISTORY — DX: Lesion of sciatic nerve, bilateral lower limbs: G57.03

## 2018-11-28 HISTORY — DX: Personal history of other infectious and parasitic diseases: Z86.19

## 2018-11-28 HISTORY — DX: Benign paroxysmal vertigo, unspecified ear: H81.10

## 2018-11-28 HISTORY — PX: ESOPHAGOGASTRODUODENOSCOPY (EGD) WITH PROPOFOL: SHX5813

## 2018-11-28 SURGERY — COLONOSCOPY WITH PROPOFOL
Anesthesia: General

## 2018-11-28 MED ORDER — SODIUM CHLORIDE 0.9 % IV SOLN: INTRAVENOUS | Status: DC

## 2018-11-28 MED ORDER — SODIUM CHLORIDE 0.9 % IV SOLN
INTRAVENOUS | Status: DC
  Administered 2018-11-28: 08:00:00 via INTRAVENOUS

## 2018-11-28 MED ORDER — PROPOFOL 500 MG/50ML IV EMUL
INTRAVENOUS | Status: AC
  Filled 2018-11-28: qty 50

## 2018-11-28 MED ORDER — PIPERACILLIN-TAZOBACTAM 3.375 G IVPB 30 MIN
3.3750 g | Freq: Once | INTRAVENOUS | Status: AC
  Administered 2018-11-28: 3.375 g via INTRAVENOUS
  Filled 2018-11-28: qty 50

## 2018-11-28 MED ORDER — LIDOCAINE HCL (CARDIAC) PF 100 MG/5ML IV SOSY
PREFILLED_SYRINGE | INTRAVENOUS | Status: DC | PRN
  Administered 2018-11-28: 50 mg via INTRAVENOUS

## 2018-11-28 MED ORDER — PROPOFOL 10 MG/ML IV BOLUS
INTRAVENOUS | Status: DC | PRN
  Administered 2018-11-28 (×2): 30 mg via INTRAVENOUS
  Administered 2018-11-28: 50 mg via INTRAVENOUS

## 2018-11-28 MED ORDER — PROPOFOL 500 MG/50ML IV EMUL
INTRAVENOUS | Status: DC | PRN
  Administered 2018-11-28: 175 ug/kg/min via INTRAVENOUS

## 2018-11-28 NOTE — Progress Notes (Signed)
Colonoscopy completed by Dr Mechele Collin, diagnosis of Diverticulosis and Internal Hemorrhoids documented.  No specimens taken.

## 2018-11-28 NOTE — H&P (Signed)
Primary Care Physician:  Orene Desanctis, MD Primary Gastroenterologist:  Dr. Mechele Collin  Pre-Procedure History & Physical: HPI:  Cindy Anthony is a 68 y.o. female is here for an endoscopy and colonoscopy.  Previous procedures done in [place].   Past Medical History:  Diagnosis Date  . Anxiety   . Arthritis    OSTEOARTHRITIS  . Benign paroxysmal positional vertigo   . Depression   . GERD (gastroesophageal reflux disease)   . Hemorrhoids   . History of chicken pox   . Hyperlipidemia   . Hypertension   . Piriformis syndrome of both sides   . Sleep apnea     Past Surgical History:  Procedure Laterality Date  . ABDOMINAL HYSTERECTOMY    . BARIATRIC SURGERY  2013   sleeve  . JOINT REPLACEMENT    . SHOULDER ARTHROSCOPY Right 2007  . TOTAL KNEE ARTHROPLASTY Right 2009    Prior to Admission medications   Medication Sig Start Date End Date Taking? Authorizing Provider  celecoxib (CELEBREX) 100 MG capsule Take 100 mg by mouth 2 (two) times daily.   Yes [provider]  docusate sodium (COLACE) 100 MG capsule Take 1 capsule (100 mg total) by mouth daily as needed. 07/23/18 07/23/19 Yes Cuthriell, Delorise Royals, PA-C  irbesartan-hydrochlorothiazide (AVALIDE) 150-12.5 MG tablet Take 1 tablet by mouth daily.   Yes [provider]  Lidocaine 2 % GEL Apply 1 application topically 3 (three) times daily as needed. 07/23/18  Yes Cuthriell, Delorise Royals, PA-C  omeprazole (PRILOSEC) 40 MG capsule Take 40 mg by mouth daily as needed (acid reflux).    Yes [provider]  fluticasone (FLONASE) 50 MCG/ACT nasal spray Place 1 spray into both nostrils daily.    [provider]  LORazepam (ATIVAN) 0.5 MG tablet Take 0.5 mg by mouth every 8 (eight) hours as needed for anxiety.    [provider]  losartan-hydrochlorothiazide (HYZAAR) 50-12.5 MG tablet Take 1 tablet by mouth daily.    [provider]  traMADol (ULTRAM) 50 MG tablet Take by mouth as needed.     [provider]    Allergies as of 09/11/2018 - Review Complete 08/29/2018  Allergen Reaction Noted  . Amlodipine Diarrhea 08/06/2016  . Lisinopril Other (See Comments) 11/02/2016  . Other Other (See Comments) 04/30/2016    Family History  Problem Relation Age of Onset  . Arthritis Mother   . Hyperlipidemia Mother   . Heart disease Mother   . Stroke Mother   . Hypertension Mother   . Kidney disease Mother   . Diabetes Mother   . Colon polyps Mother   . Heart attack Mother   . Arthritis Father   . Heart disease Father   . Diabetes Paternal Grandmother   . Hyperlipidemia Sister   . Hypertension Brother     Social History   Socioeconomic History  . Marital status: Divorced    Spouse name: Not on file  . Number of children: 3  . Years of education: Not on file  . Highest education level: Not on file  Occupational History  . Occupation: Education officer, environmental  Social Needs  . Financial resource strain: Not on file  . Food insecurity:    Worry: Not on file    Inability: Not on file  . Transportation needs:    Medical: Not on file    Non-medical: Not on file  Tobacco Use  . Smoking status: Never Smoker  . Smokeless tobacco: Never Used  Substance and Sexual  Activity  . Alcohol use: No    Alcohol/week: 0.0 standard drinks  . Drug use: No  . Sexual activity: Not on file  Lifestyle  . Physical activity:    Days per week: Not on file    Minutes per session: Not on file  . Stress: Not on file  Relationships  . Social connections:    Talks on phone: Not on file    Gets together: Not on file    Attends religious service: Not on file    Active member of club or organization: Not on file    Attends meetings of clubs or organizations: Not on file    Relationship status: Not on file  . Intimate partner violence:    Fear of current or ex partner: Not on file    Emotionally abused: Not on file    Physically abused: Not on file    Forced sexual activity: Not on  file  Other Topics Concern  . Not on file  Social History Narrative  . Not on file    Review of Systems: See HPI, otherwise negative ROS  Physical Exam: BP (!) 170/73   Pulse (!) 59   Temp (!) 96.4 F (35.8 C) (Tympanic)   Resp 18   Ht 5\' 3"  (1.6 m)   Wt 117.9 kg   SpO2 100%   BMI 46.06 kg/m  General:   Alert,  pleasant and cooperative in NAD Head:  Normocephalic and atraumatic. Neck:  Supple; no masses or thyromegaly. Lungs:  Clear throughout to auscultation.    Heart:  Regular rate and rhythm. Abdomen:  Soft, nontender and nondistended. Normal bowel sounds, without guarding, and without rebound.   Neurologic:  Alert and  oriented x4;  grossly normal neurologically.  Impression/Plan: Cindy Anthony is here for an endoscopy and colonoscopy to be performed for FH colon cancer in brother.  Also to inspect her previous gastric sleeve.  Risks, benefits, limitations, and alternatives regarding  Colonoscopy and EGD  have been reviewed with the patient.  Questions have been answered.  All parties agreeable.   Lynnae PrudeELLIOTT, Nyellie Yetter, MD  11/28/2018, 8:25 AM

## 2018-11-28 NOTE — Progress Notes (Signed)
Upper Endoscopy completed, no specimens taken by Dr. Mechele Collin.  Diagnosis of Hiatal Hernia documented.

## 2018-11-28 NOTE — Op Note (Signed)
[facility] Gastroenterology Patient Name: Cindy FortsMadelyn Karczewski Procedure Date: 11/28/2018 8:24 AM MRN: [id number] Account #: [id number] Date of Birth: 06/15/1951 Admit Type: Outpatient Age: [age] [facility] Gender: Female Note Status: Finalized Procedure:            Colonoscopy Indications:          Screening for colorectal malignant neoplasm Providers:            Scot Junobert T. Africa Masaki, MD Referring MD:         Daniel NonesKaren J. Behling, MD (Referring MD) Medicines:            Propofol per Anesthesia Complications:        No immediate complications. Procedure:            Pre-Anesthesia Assessment:                       - After reviewing the risks and benefits, the patient                        was deemed in satisfactory condition to undergo the                        procedure.                       After obtaining informed consent, the colonoscope was                        passed under direct vision. Throughout the procedure,                        the patient's blood pressure, pulse, and oxygen                        saturations were monitored continuously. The                        Colonoscope was introduced through the anus and                        advanced to the the terminal ileum. The colonoscopy was                        performed without difficulty. The patient tolerated the                        procedure well. The quality of the bowel preparation                        was excellent. Findings:      A single small-mouthed diverticulum was found in the sigmoid colon.      Internal hemorrhoids were found during endoscopy. The hemorrhoids were       small and Grade I (internal hemorrhoids that do not prolapse).      The exam was otherwise without abnormality. Impression:           - Diverticulosis in the sigmoid colon.                       - Internal hemorrhoids.                       -  The examination was otherwise normal.                       - No  specimens collected. Recommendation:       - Repeat colonoscopy in 10 years for screening purposes. Scot Jun, MD 11/28/2018 9:07:25 AM This report has been signed electronically. Number of Addenda: 0 Note Initiated On: 11/28/2018 8:24 AM Scope Withdrawal Time: 0 hours 8 minutes 1 second  Total Procedure Duration: 0 hours 14 minutes 56 seconds       [facility]

## 2018-11-28 NOTE — Anesthesia Post-op Follow-up Note (Signed)
Anesthesia QCDR form completed.        

## 2018-11-28 NOTE — Anesthesia Preprocedure Evaluation (Signed)
Anesthesia Evaluation  Patient identified by MRN, date of birth, ID band Patient awake    Reviewed: Allergy & Precautions, H&P , NPO status , Patient's Chart, lab work & pertinent test results, reviewed documented beta blocker date and time   Airway Mallampati: II   Neck ROM: full    Dental  (+) Poor Dentition   Pulmonary sleep apnea and Continuous Positive Airway Pressure Ventilation ,    Pulmonary exam normal        Cardiovascular Exercise Tolerance: Good hypertension, On Medications negative cardio ROS Normal cardiovascular exam Rhythm:regular Rate:Normal     Neuro/Psych PSYCHIATRIC DISORDERS Anxiety Depression  Neuromuscular disease    GI/Hepatic Neg liver ROS, GERD  Medicated,  Endo/Other  negative endocrine ROS  Renal/GU negative Renal ROS  negative genitourinary   Musculoskeletal   Abdominal   Peds  Hematology negative hematology ROS (+)   Anesthesia Other Findings Past Medical History: No date: Anxiety No date: Arthritis     Comment:  OSTEOARTHRITIS No date: Benign paroxysmal positional vertigo No date: Depression No date: GERD (gastroesophageal reflux disease) No date: Hemorrhoids No date: History of chicken pox No date: Hyperlipidemia No date: Hypertension No date: Piriformis syndrome of both sides No date: Sleep apnea Past Surgical History: No date: ABDOMINAL HYSTERECTOMY 2013: BARIATRIC SURGERY     Comment:  sleeve No date: JOINT REPLACEMENT 2007: SHOULDER ARTHROSCOPY; Right 2009: TOTAL KNEE ARTHROPLASTY; Right   Reproductive/Obstetrics negative OB ROS                             Anesthesia Physical Anesthesia Plan  ASA: III  Anesthesia Plan: General   Post-op Pain Management:    Induction:   PONV Risk Score and Plan:   Airway Management Planned:   Additional Equipment:   Intra-op Plan:   Post-operative Plan:   Informed Consent: I have reviewed  the patients History and Physical, chart, labs and discussed the procedure including the risks, benefits and alternatives for the proposed anesthesia with the patient or authorized representative who has indicated his/her understanding and acceptance.   Dental Advisory Given  Plan Discussed with: CRNA  Anesthesia Plan Comments:         Anesthesia Quick Evaluation

## 2018-11-28 NOTE — Transfer of Care (Signed)
Immediate Anesthesia Transfer of Care Note  Patient: Cindy Anthony  Procedure(s) Performed: COLONOSCOPY WITH PROPOFOL (N/A ) ESOPHAGOGASTRODUODENOSCOPY (EGD) WITH PROPOFOL (N/A )  Patient Location: PACU  Anesthesia Type:General  Level of Consciousness: awake, alert  and oriented  Airway & Oxygen Therapy: Patient Spontanous Breathing  Post-op Assessment: Report given to RN and Post -op Vital signs reviewed and stable  Post vital signs: Reviewed and stable  Last Vitals:  Vitals Value Taken Time  BP 106/72 11/28/2018  9:10 AM  Temp 36.1 C 11/28/2018  9:10 AM  Pulse 69 11/28/2018  9:11 AM  Resp 20 11/28/2018  9:11 AM  SpO2 98 % 11/28/2018  9:11 AM  Vitals shown include unvalidated device data.  Last Pain:  Vitals:   11/28/18 0910  TempSrc: Tympanic  PainSc:          Complications: No apparent anesthesia complications

## 2018-11-28 NOTE — Anesthesia Procedure Notes (Addendum)
Date/Time: 11/28/2018 8:32 AM Performed by: Ginger Carne, CRNA Pre-anesthesia Checklist: Patient identified, Emergency Drugs available, Suction available, Patient being monitored and Timeout performed Patient Re-evaluated:Patient Re-evaluated prior to induction Oxygen Delivery Method: Nasal cannula Preoxygenation: Pre-oxygenation with 100% oxygen Induction Type: IV induction

## 2018-11-28 NOTE — Op Note (Signed)
[facility] Gastroenterology Patient Name: Cindy Anthony Procedure Date: 11/28/2018 8:24 AM MRN: [id number] Account #: [id number] Date of Birth: September 29, 1951 Admit Type: Outpatient Age: 68 Room: [facility] ENDO ROOM 1 Gender: Female Note Status: Finalized Procedure:            Upper GI endoscopy Indications:          Heartburn, Assessment following partial gastrectomy Providers:            Scot Jun, MD Referring MD:         Daniel Nones, MD (Referring MD) Medicines:            Propofol per Anesthesia Complications:        No immediate complications. Procedure:            Pre-Anesthesia Assessment:                       - After reviewing the risks and benefits, the patient                        was deemed in satisfactory condition to undergo the                        procedure.                       After obtaining informed consent, the endoscope was                        passed under direct vision. Throughout the procedure,                        the patient's blood pressure, pulse, and oxygen                        saturations were monitored continuously. The Endoscope                        was introduced through the mouth, and advanced to the                        second part of duodenum. The upper GI endoscopy was                        accomplished without difficulty. The patient tolerated                        the procedure well. Findings:      The examined esophagus was normal. GEJ 36cm.      A small hiatal hernia was present.      Evidence of a sleeve gastrectomy was found in the gastric body and in       the gastric antrum. No ulcers no obvious inflammation. Impression:           - Normal esophagus.                       - Small hiatal hernia.                       - A sleeve gastrectomy was found.                       -  No specimens collected. Recommendation:       - Perform a colonoscopy today. Scot Junobert T Lucresia Simic, MD 11/28/2018 8:47:21  AM This report has been signed electronically. Number of Addenda: 0 Note Initiated On: 11/28/2018 8:24 AM      [facility] - D[facility]

## 2018-12-02 NOTE — Anesthesia Postprocedure Evaluation (Signed)
Anesthesia Post Note  Patient: Reginia FortsMadelyn Erskin  Procedure(s) Performed: COLONOSCOPY WITH PROPOFOL (N/A ) ESOPHAGOGASTRODUODENOSCOPY (EGD) WITH PROPOFOL (N/A )  Patient location during evaluation: PACU Anesthesia Type: General Level of consciousness: awake and alert Pain management: pain level controlled Vital Signs Assessment: post-procedure vital signs reviewed and stable Respiratory status: spontaneous breathing, nonlabored ventilation, respiratory function stable and patient connected to nasal cannula oxygen Cardiovascular status: blood pressure returned to baseline and stable Postop Assessment: no apparent nausea or vomiting Anesthetic complications: no     Last Vitals:  Vitals:   11/28/18 0910 11/28/18 0929  BP: 106/72 (!) 152/82  Pulse: 75   Resp: 16   Temp: (!) 36.1 C   SpO2: 96%     Last Pain:  Vitals:   11/28/18 0939  TempSrc:   PainSc: 0-No pain                 Yevette EdwardsJames G Laira Penninger

## 2019-01-26 ENCOUNTER — Encounter: Payer: Self-pay | Admitting: Emergency Medicine

## 2019-01-26 ENCOUNTER — Other Ambulatory Visit: Payer: Self-pay

## 2019-01-26 DIAGNOSIS — R300 Dysuria: Secondary | ICD-10-CM | POA: Insufficient documentation

## 2019-01-26 DIAGNOSIS — Z5321 Procedure and treatment not carried out due to patient leaving prior to being seen by health care provider: Secondary | ICD-10-CM | POA: Diagnosis not present

## 2019-01-26 NOTE — ED Triage Notes (Signed)
Pt with hematuria, dysuria, and frequency since this AM. Pt denies fever.

## 2019-01-27 ENCOUNTER — Emergency Department
Admission: EM | Admit: 2019-01-27 | Discharge: 2019-01-27 | Disposition: A | Discharge status: Home / Self Care | Payer: Medicare Other | Attending: Emergency Medicine | Admitting: Emergency Medicine

## 2019-01-27 LAB — URINALYSIS, COMPLETE (UACMP) WITH MICROSCOPIC
BACTERIA UA: NONE SEEN
Bilirubin Urine: NEGATIVE
Glucose, UA: NEGATIVE mg/dL
Ketones, ur: NEGATIVE mg/dL
Nitrite: NEGATIVE
Protein, ur: 100 mg/dL — AB
RBC / HPF: >50 RBC/hpf — ABNORMAL HIGH (ref 0–5)
Specific Gravity, Urine: 1.024 (ref 1.005–1.030)
pH: 5 (ref 5.0–8.0)

## 2019-08-04 ENCOUNTER — Telehealth: Payer: Self-pay

## 2019-08-04 DIAGNOSIS — G4733 Obstructive sleep apnea (adult) (pediatric): Secondary | ICD-10-CM

## 2019-08-04 NOTE — Telephone Encounter (Signed)
Spoke with pt, she would like to change her DME company from [place] [employer] to another DME She is not having a great experience with [employer] and states when she had [employer] it was better. I think [employer] bought their company out. Can we change DME companies? Please advise.

## 2019-08-05 NOTE — Telephone Encounter (Signed)
OK to change?

## 2019-08-05 NOTE — Telephone Encounter (Signed)
Called the patient back to make her aware Dr. Elsworth Soho gave the approval for the DME company to be changed.  Patient advised to look out for call from [facility] Patient as they would contact her with any questions they have regarding the order.  Order placed with notation for the DME to call this patient regarding supplies as she has had difficulty getting what she needs.  Nothing further needed at this time.

## 2019-09-18 ENCOUNTER — Other Ambulatory Visit: Payer: Self-pay

## 2019-09-18 ENCOUNTER — Encounter: Payer: Self-pay | Admitting: Pulmonary Disease

## 2019-09-18 ENCOUNTER — Ambulatory Visit (INDEPENDENT_AMBULATORY_CARE_PROVIDER_SITE_OTHER): Payer: Medicare Other | Admitting: Pulmonary Disease

## 2019-09-18 DIAGNOSIS — G4733 Obstructive sleep apnea (adult) (pediatric): Secondary | ICD-10-CM

## 2019-09-18 NOTE — Assessment & Plan Note (Signed)
CPAP is set at 7 cm and seems to be working well. She is compliant and has good improvement in daytime somnolence and fatigue  Weight loss encouraged, compliance with goal of at least 4-6 hrs every night is the expectation. Advised against medications with sedative side effects Cautioned against driving when sleepy - understanding that sleepiness will vary on a day to day basis

## 2019-09-18 NOTE — Patient Instructions (Signed)
CPAP supplies will be renewed as needed. CPAP seems to be working well

## 2019-09-18 NOTE — Assessment & Plan Note (Signed)
Weight loss encouraged 

## 2019-09-18 NOTE — Progress Notes (Signed)
   Subjective:    Patient ID: Cindy Anthony, female    DOB: 02/07/1951, 68 y.o.   MRN: [id number]  HPI  68 yo for FU of obstructive sleep apnea. She was diagnosed with OSA in 2008, maintained on CPAP 7 cm with a small nasal mask.  She underwent gastric bypass in 2010 , her peak weight was 288, now at 278  She had issues with her DME and recently we helped her change from [place] to [place] patient, she is much happier with the service she has received She was able to change her headgear, has settled down with nasal pillows No problems with pressure. She does report occasional sinus headaches, blood pressure is well controlled, PCP is planning to increase her BP meds  Significant tests/ events  02/2009 CPAP titration >> 7 cm  Past Medical History:  Diagnosis Date  . Anxiety   . Arthritis    OSTEOARTHRITIS  . Benign paroxysmal positional vertigo   . Depression   . GERD (gastroesophageal reflux disease)   . Hemorrhoids   . History of chicken pox   . Hyperlipidemia   . Hypertension   . Piriformis syndrome of both sides   . Sleep apnea      Review of Systems Patient denies significant dyspnea,cough, hemoptysis,  chest pain, palpitations, pedal edema, orthopnea, paroxysmal nocturnal dyspnea, lightheadedness, nausea, vomiting, abdominal or  leg pains      Objective:   Physical Exam  Gen. Pleasant, obese, in no distress ENT - no lesions, no post nasal drip Neck: No JVD, no thyromegaly, no carotid bruits Lungs: no use of accessory muscles, no dullness to percussion, decreased without rales or rhonchi  Cardiovascular: Rhythm regular, heart sounds  normal, no murmurs or gallops, no peripheral edema Musculoskeletal: No deformities, no cyanosis or clubbing , no tremors        Assessment & Plan:

## 2020-09-25 ENCOUNTER — Emergency Department: Payer: Medicare Other

## 2020-09-25 ENCOUNTER — Other Ambulatory Visit: Payer: Self-pay

## 2020-09-25 ENCOUNTER — Emergency Department
Admission: EM | Admit: 2020-09-25 | Discharge: 2020-09-25 | Disposition: A | Discharge status: Home / Self Care | Payer: Medicare Other | Attending: Emergency Medicine | Admitting: Emergency Medicine

## 2020-09-25 DIAGNOSIS — I10 Essential (primary) hypertension: Secondary | ICD-10-CM

## 2020-09-25 DIAGNOSIS — R519 Headache, unspecified: Secondary | ICD-10-CM

## 2020-09-25 DIAGNOSIS — Z96651 Presence of right artificial knee joint: Secondary | ICD-10-CM | POA: Diagnosis not present

## 2020-09-25 DIAGNOSIS — Z79899 Other long term (current) drug therapy: Secondary | ICD-10-CM | POA: Diagnosis not present

## 2020-09-25 MED ORDER — KETOROLAC TROMETHAMINE 60 MG/2ML IM SOLN
60.0000 mg | Freq: Once | INTRAMUSCULAR | Status: AC
  Administered 2020-09-25: 60 mg via INTRAMUSCULAR
  Filled 2020-09-25: qty 2

## 2020-09-25 MED ORDER — METOCLOPRAMIDE HCL 10 MG PO TABS
10.0000 mg | ORAL_TABLET | Freq: Once | ORAL | Status: AC
  Administered 2020-09-25: 10 mg via ORAL
  Filled 2020-09-25: qty 1

## 2020-09-25 MED ORDER — KETOROLAC TROMETHAMINE 30 MG/ML IJ SOLN: 30.0000 mg | Freq: Once | INTRAMUSCULAR | Status: DC

## 2020-09-25 MED ORDER — METHYLPREDNISOLONE SODIUM SUCC 125 MG IJ SOLR: 125.0000 mg | Freq: Once | INTRAMUSCULAR | Status: DC

## 2020-09-25 MED ORDER — SODIUM CHLORIDE 0.9 % IV BOLUS: 500.0000 mL | Freq: Once | INTRAVENOUS | Status: DC

## 2020-09-25 MED ORDER — METOCLOPRAMIDE HCL 5 MG/ML IJ SOLN: 10.0000 mg | Freq: Once | INTRAMUSCULAR | Status: DC

## 2020-09-25 NOTE — ED Notes (Signed)
Multiple unsuccessful IV attempts at this time. Dr. Lenard Lance, MD made aware.

## 2020-09-25 NOTE — ED Notes (Signed)
This nurse attempted IV placement 2 times. Pt states IVs always difficult to be placed. Will bring another nurse to attempt IV placement.

## 2020-09-25 NOTE — ED Triage Notes (Signed)
Pt comes POV with HTN and dizziness. Pt was given a nasal spray and thinks it made it worse. Stopped the spray 6 days ago. Pt also recently rx a triptan h/a med and htn getting worse.

## 2020-09-25 NOTE — ED Provider Notes (Signed)
[facility] Emergency Department Provider Note  Time seen: 10:39 AM  I have reviewed the triage vital signs and the nursing notes.   HISTORY  Chief Complaint Hypertension and Dizziness   HPI Cindy Anthony is a 69 y.o. female with a past medical history anxiety, gastric reflux, hypertension, hyperlipidemia, presents to the emergency department for headache and high blood pressure.  According to the patient for the past several months she has been experiencing very frequent headaches.  Patient saw a neurologist who thinks the headaches could be due to the patient recently using a computer for work for prolonged time periods.  They prescribed the patient a triptan which the patient took but did not help with a headache.  Patient is also been using a nasal spray thinking the headache could be more congestion related.  Patient has noted that her blood pressure has been fluctuating considerably as high as 190 systolic which concerned the patient.  No chest pain.  No fever.  Patient states moderate headache currently.  Has not had any recent brain imaging.  Patient denies any history of migraines prior to several months ago.   Patient states her neurologist is going to schedule an MRI in the future if her headaches do not resolve.  Past Medical History:  Diagnosis Date  . Anxiety   . Arthritis    OSTEOARTHRITIS  . Benign paroxysmal positional vertigo   . Depression   . GERD (gastroesophageal reflux disease)   . Hemorrhoids   . History of chicken pox   . Hyperlipidemia   . Hypertension   . Piriformis syndrome of both sides   . Sleep apnea     Patient Active Problem List   Diagnosis Date Noted  . Chronic mesenteric ischemia (HCC) 07/25/2015  . Bacteriuria with pyuria 07/03/2015  . Left flank pain, chronic 06/30/2015  . Essential hypertension 05/12/2015  . Morbid obesity (HCC) 03/03/2015  . Hip pain 03/03/2015  . GERD (gastroesophageal reflux disease) 03/03/2015   . OSA (obstructive sleep apnea) 03/03/2015    Past Surgical History:  Procedure Laterality Date  . ABDOMINAL HYSTERECTOMY    . BARIATRIC SURGERY  2013   sleeve  . COLONOSCOPY WITH PROPOFOL N/A 11/28/2018   Procedure: COLONOSCOPY WITH PROPOFOL;  Surgeon: Scot Jun, MD;  Location: [facility] ENDOSCOPY;  Service: Endoscopy;  Laterality: N/A;  . ESOPHAGOGASTRODUODENOSCOPY (EGD) WITH PROPOFOL N/A 11/28/2018   Procedure: ESOPHAGOGASTRODUODENOSCOPY (EGD) WITH PROPOFOL;  Surgeon: Scot Jun, MD;  Location: [facility] ENDOSCOPY;  Service: Endoscopy;  Laterality: N/A;  . JOINT REPLACEMENT    . SHOULDER ARTHROSCOPY Right 2007  . TOTAL KNEE ARTHROPLASTY Right 2009    Prior to Admission medications   Medication Sig Start Date End Date Taking? Authorizing Provider  fluticasone (FLONASE) 50 MCG/ACT nasal spray Place 1 spray into both nostrils daily.    [provider]  irbesartan-hydrochlorothiazide (AVALIDE) 150-12.5 MG tablet Take 1 tablet by mouth daily.    [provider]  irbesartan-hydrochlorothiazide (AVALIDE) 300-12.5 MG tablet Take by mouth. 09/17/19 09/16/20  [provider]  Lidocaine 2 % GEL Apply 1 application topically 3 (three) times daily as needed. 07/23/18   Cuthriell, Delorise Royals, PA-C  LORazepam (ATIVAN) 0.5 MG tablet Take 0.5 mg by mouth every 8 (eight) hours as needed for anxiety.    [provider]  losartan-hydrochlorothiazide (HYZAAR) 50-12.5 MG tablet Take 1 tablet by mouth daily.    [provider]  omeprazole (PRILOSEC) 40 MG capsule Take 40 mg by mouth daily  as needed (acid reflux).     [provider]  traMADol (ULTRAM) 50 MG tablet Take by mouth as needed.    [provider]    Allergies  Allergen Reactions  . Amlodipine Diarrhea  . Lisinopril Other (See Comments)    Dizziness  . Other Other (See Comments)  . Shellfish-Derived Products Other (See Comments)    Family History  Problem Relation Age of  Onset  . Arthritis Mother   . Hyperlipidemia Mother   . Heart disease Mother   . Stroke Mother   . Hypertension Mother   . Kidney disease Mother   . Diabetes Mother   . Colon polyps Mother   . Heart attack Mother   . Arthritis Father   . Heart disease Father   . Diabetes Paternal Grandmother   . Hyperlipidemia Sister   . Hypertension Brother     Social History Social History   Tobacco Use  . Smoking status: Never Smoker  . Smokeless tobacco: Never Used  Vaping Use  . Vaping Use: Never used  Substance Use Topics  . Alcohol use: No    Alcohol/week: 0.0 standard drinks  . Drug use: No    Review of Systems Constitutional: Negative for fever Cardiovascular: Negative for chest pain. Respiratory: Negative for shortness of breath. Gastrointestinal: Negative for abdominal pain, vomiting Musculoskeletal: Negative for musculoskeletal complaints Neurological: Intermittent headaches, moderate currently. All other ROS negative  ____________________________________________   PHYSICAL EXAM:  VITAL SIGNS: ED Triage Vitals  Enc Vitals Group     BP 09/25/20 1016 (!) 156/60     Pulse Rate 09/25/20 1016 75     Resp 09/25/20 1016 18     Temp 09/25/20 1017 98.3 F (36.8 C)     Temp Source 09/25/20 1017 Oral     SpO2 09/25/20 1016 100 %     Weight 09/25/20 1018 269 lb (122 kg)     Height 09/25/20 1018 5\' 3"  (1.6 m)     Head Circumference --      Peak Flow --      Pain Score 09/25/20 1016 0     Pain Loc --      Pain Edu? --      Excl. in GC? --     Constitutional: Alert and oriented. Well appearing and in no distress. Eyes: Normal exam ENT      Head: Normocephalic and atraumatic.      Mouth/Throat: Mucous membranes are moist. Cardiovascular: Normal rate, regular rhythm.  Respiratory: Normal respiratory effort without tachypnea nor retractions. Breath sounds are clear  Gastrointestinal: Soft and nontender. No distention.   Musculoskeletal: Nontender with normal range of  motion in all extremities.  Neurologic:  Normal speech and language. No gross focal neurologic deficits  Skin:  Skin is warm, dry and intact.  Psychiatric: Mood and affect are normal.  ____________________________________________     RADIOLOGY  CT scan of the head is negative  ____________________________________________   INITIAL IMPRESSION / ASSESSMENT AND PLAN / ED COURSE  Pertinent labs & imaging results that were available during my care of the patient were reviewed by me and considered in my medical decision making (see chart for details).   Patient presents to the emergency department for high blood pressure and intermittent headaches over the past several months.  Patient is recently using a decongestant nasal spray as well as triptan prescribed by her neurologist for her headaches.  This would very likely cause fluctuations in increases in blood pressure.  No concerning or red flag findings on physical exam.  However given no long history of headaches with recent significant headaches we will obtain a CT scan of the head as a precaution to rule out intracranial abnormality.  We will treat with Toradol Reglan and IV fluids and Solu-Medrol.  Patient is driving herself home.  If the patient's work-up is nonrevealing I believe the patient could be safely discharged home with neurology follow-up for further evaluation and possible more detailed imaging in the future if needed.  Patient agreeable to plan of care.  CT scan head is negative.  Patient is feeling much better after medications.  We will discharge home with neurology follow-up.  Patient agreeable to plan of care.  Cindy Anthony was evaluated in Emergency Department on 09/25/2020 for the symptoms described in the history of present illness. She was evaluated in the context of the global COVID-19 pandemic, which necessitated consideration that the patient might be at risk for infection with the SARS-CoV-2 virus that causes  COVID-19. Institutional protocols and algorithms that pertain to the evaluation of patients at risk for COVID-19 are in a state of rapid change based on information released by regulatory bodies including the CDC and federal and state organizations. These policies and algorithms were followed during the patient's care in the ED.  ____________________________________________   FINAL CLINICAL IMPRESSION(S) / ED DIAGNOSES  Hypertension Headache   Minna Antis, MD 09/25/20 1317

## 2020-09-25 NOTE — ED Notes (Signed)
Pt returned from CT at this time.  

## 2020-10-03 NOTE — Progress Notes (Signed)
@Patient  ID: , female    DOB: 10/06/1951, 69 y.o.   MRN: [id number]  Chief Complaint  Patient presents with  . Follow-up    Problems with CPAP-has woke up frequently "gasping",pr. good    Referring provider: [id number], MD  HPI: 69 year old female, never smoked. PMH significant for hypertension, obstructive sleep apnea, GERD.  Patient of Dr. 78, last seen in office on 09/18/2019. Diagnosed with sleep apnea in 2008, maintained on CPAP at 7 cm H2O with small nasal mask.  History of gastric bypass in 2010, peak weight 288 lbs. DME company is [employer].   10/04/2020 Patient presents today for a 1 year follow-up OSA on CPAP. She is compliant with CPAP use, having some trouble recently. States that on four separate occasions she has woken up feeling short of breath/gasping for air. She is more tired. She wears nasal pillow mask. She is cleaning and changing out mask once a month. She does sleep with her mouth open.   Airview download 07/25/18-08/23/18 30/30 days used (100%); 28 days (93%) > 4 hours Average usage days used 7 hours 38 mins Pressure 7cm h20 Airleaks 20.2L/min AHI 1.6  Allergies  Allergen Reactions  . Amlodipine Diarrhea  . Lisinopril Other (See Comments)    Dizziness  . Other Other (See Comments)  . Shellfish-Derived Products Other (See Comments)    Immunization History  Administered Date(s) Administered  . Fluad Quad(high Dose 65+) 08/26/2020  . Influenza,inj,Quad PF,6+ Mos 11/26/2014, 08/26/2015  . Influenza-Unspecified 08/24/2016  . PFIZER SARS-COV-2 Vaccination 01/16/2020, 02/07/2020, 09/16/2020  . Tdap 11/26/2010  . Zoster 04/26/2014    Past Medical History:  Diagnosis Date  . Anxiety   . Arthritis    OSTEOARTHRITIS  . Benign paroxysmal positional vertigo   . Depression   . GERD (gastroesophageal reflux disease)   . Hemorrhoids   . History of chicken pox   . Hyperlipidemia   . Hypertension   . Piriformis syndrome of both sides   .  Sleep apnea     Tobacco History: Social History   Tobacco Use  Smoking Status Never Smoker  Smokeless Tobacco Never Used   Counseling given: Not Answered   Outpatient Medications Prior to Visit  Medication Sig Dispense Refill  . irbesartan-hydrochlorothiazide (AVALIDE) 150-12.5 MG tablet Take 1 tablet by mouth daily.    . Lidocaine 2 % GEL Apply 1 application topically 3 (three) times daily as needed. 30 g 1  . LORazepam (ATIVAN) 0.5 MG tablet Take 0.5 mg by mouth every 8 (eight) hours as needed for anxiety.    . fluticasone (FLONASE) 50 MCG/ACT nasal spray Place 1 spray into both nostrils daily. (Patient not taking: Reported on 10/04/2020)    . irbesartan-hydrochlorothiazide (AVALIDE) 300-12.5 MG tablet Take by mouth.    . losartan-hydrochlorothiazide (HYZAAR) 50-12.5 MG tablet Take 1 tablet by mouth daily. (Patient not taking: Reported on 10/04/2020)    . omeprazole (PRILOSEC) 40 MG capsule Take 40 mg by mouth daily as needed (acid reflux).  (Patient not taking: Reported on 10/04/2020)    . traMADol (ULTRAM) 50 MG tablet Take by mouth as needed. (Patient not taking: Reported on 10/04/2020)     No facility-administered medications prior to visit.    Review of Systems  Review of Systems  Constitutional: Positive for fatigue.  Respiratory: Negative.   Psychiatric/Behavioral: Positive for sleep disturbance.    Physical Exam  BP 134/72 (BP Location: Right Arm, Cuff Size: Large)   Pulse (!) 54   Temp (!) 97.2  F (36.2 C) (Temporal)   Ht 5\' 3"  (1.6 m)   Wt 276 lb 6.4 oz (125.4 kg)   SpO2 97%   BMI 48.96 kg/m  Physical Exam Constitutional:      Appearance: Normal appearance.  HENT:     Head: Normocephalic and atraumatic.     Mouth/Throat:     Mouth: Mucous membranes are moist.     Pharynx: Oropharynx is clear.  Cardiovascular:     Rate and Rhythm: Normal rate and regular rhythm.  Pulmonary:     Effort: Pulmonary effort is normal.     Breath sounds: Normal breath  sounds.  Skin:    General: Skin is warm and dry.  Neurological:     General: No focal deficit present.     Mental Status: She is alert and oriented to person, place, and time. Mental status is at baseline.  Psychiatric:        Mood and Affect: Mood normal.        Behavior: Behavior normal.        Thought Content: Thought content normal.        Judgment: Judgment normal.      Lab Results:  CBC    Component Value Date/Time   WBC 9.6 03/01/2017 2330   RBC 4.85 03/01/2017 2330   HGB 14.7 03/01/2017 2330   HCT 43.3 03/01/2017 2330   PLT 237 03/01/2017 2330   MCV 89.4 03/01/2017 2330   MCH 30.3 03/01/2017 2330   MCHC 33.9 03/01/2017 2330   RDW 15.2 (H) 03/01/2017 2330   LYMPHSABS 1.7 01/11/2017 2104   MONOABS 0.6 01/11/2017 2104   EOSABS 0.0 01/11/2017 2104   BASOSABS 0.0 01/11/2017 2104    BMET    Component Value Date/Time   NA 139 03/01/2017 2330   K 4.1 03/01/2017 2330   CL 105 03/01/2017 2330   CO2 28 03/01/2017 2330   GLUCOSE 101 (H) 03/01/2017 2330   BUN 17 03/01/2017 2330   CREATININE 0.84 03/01/2017 2330   CALCIUM 9.4 03/01/2017 2330   GFRNONAA >60 03/01/2017 2330   GFRAA >60 03/01/2017 2330    BNP No results found for: BNP  ProBNP No results found for: PROBNP  Imaging: CT Head Wo Contrast  Result Date: 09/25/2020 CLINICAL DATA:  Pt comes POV with HTN and dizziness. Pt was given a nasal spray and thinks it made it worse. Stopped the spray 6 days ago. Pt also recently rx a triptan h/a med and htn getting worse. EXAM: CT HEAD WITHOUT CONTRAST TECHNIQUE: Contiguous axial images were obtained from the base of the skull through the vertex without intravenous contrast. COMPARISON:  None. FINDINGS: Brain: No evidence of acute infarction, hemorrhage, hydrocephalus, extra-axial collection or mass lesion/mass effect. Vascular: No hyperdense vessel or unexpected calcification. Skull: Normal. Negative for fracture or focal lesion. Sinuses/Orbits: Visualized globes and  orbits are unremarkable. The visualized sinuses are clear. Other: None peer IMPRESSION: Negative unenhanced CT scan of the brain. Electronically Signed   By: 09/27/2020 M.D.   On: 09/25/2020 11:09     Assessment & Plan:   OSA (obstructive sleep apnea) Patients reports waking up several times gasping for air. She wears nasal pillow mask and is a mouth breather. Recommend changing CPAP pressure from 7 to 8cm h20 and adding chin strap. Requesting updated compliance report. FU in 1 month to review.    09/27/2020, NP 10/04/2020

## 2020-10-04 ENCOUNTER — Other Ambulatory Visit: Payer: Self-pay

## 2020-10-04 ENCOUNTER — Encounter: Payer: Self-pay | Admitting: Primary Care

## 2020-10-04 ENCOUNTER — Ambulatory Visit (INDEPENDENT_AMBULATORY_CARE_PROVIDER_SITE_OTHER): Payer: Medicare Other | Admitting: Primary Care

## 2020-10-04 VITALS — BP 134/72 | HR 54 | Temp 97.2°F | Ht 63.0 in | Wt 276.4 lb

## 2020-10-04 DIAGNOSIS — G4733 Obstructive sleep apnea (adult) (pediatric): Secondary | ICD-10-CM | POA: Diagnosis not present

## 2020-10-04 NOTE — Patient Instructions (Addendum)
Orders: - Increase CPAP pressure to 8cm h20 - Add Chin strap   Follow-up: - 1 month televisit with Beth NP    CPAP and BPAP Information CPAP and BPAP are methods of helping a person breathe with the use of air pressure. CPAP stands for "continuous positive airway pressure." BPAP stands for "bi-level positive airway pressure." In both methods, air is blown through your nose or mouth and into your air passages to help you breathe well. CPAP and BPAP use different amounts of pressure to blow air. With CPAP, the amount of pressure stays the same while you breathe in and out. With BPAP, the amount of pressure is increased when you breathe in (inhale) so that you can take larger breaths. Your health care provider will recommend whether CPAP or BPAP would be more helpful for you. Why are CPAP and BPAP treatments used? CPAP or BPAP can be helpful if you have:  Sleep apnea.  Chronic obstructive pulmonary disease (COPD).  Heart failure.  Medical conditions that weaken the muscles of the chest including muscular dystrophy, or neurological diseases such as amyotrophic lateral sclerosis (ALS).  Other problems that cause breathing to be weak, abnormal, or difficult. CPAP is most commonly used for obstructive sleep apnea (OSA) to keep the airways from collapsing when the muscles relax during sleep. How is CPAP or BPAP administered? Both CPAP and BPAP are provided by a small machine with a flexible plastic tube that attaches to a plastic mask. You wear the mask. Air is blown through the mask into your nose or mouth. The amount of pressure that is used to blow the air can be adjusted on the machine. Your health care provider will determine the pressure setting that should be used based on your individual needs. When should CPAP or BPAP be used? In most cases, the mask only needs to be worn during sleep. Generally, the mask needs to be worn throughout the night and during any daytime naps. People with  certain medical conditions may also need to wear the mask at other times when they are awake. Follow instructions from your health care provider about when to use the machine. What are some tips for using the mask?   Because the mask needs to be snug, some people feel trapped or closed-in (claustrophobic) when first using the mask. If you feel this way, you may need to get used to the mask. One way to do this is by holding the mask loosely over your nose or mouth and then gradually applying the mask more snugly. You can also gradually increase the amount of time that you use the mask.  Masks are available in various types and sizes. Some fit over your mouth and nose while others fit over just your nose. If your mask does not fit well, talk with your health care provider about getting a different one.  If you are using a mask that fits over your nose and you tend to breathe through your mouth, a chin strap may be applied to help keep your mouth closed.  The CPAP and BPAP machines have alarms that may sound if the mask comes off or develops a leak.  If you have trouble with the mask, it is very important that you talk with your health care provider about finding a way to make the mask easier to tolerate. Do not stop using the mask. Stopping the use of the mask could have a negative impact on your health. What are some tips for  using the machine?  Place your CPAP or BPAP machine on a secure table or stand near an electrical outlet.  Know where the on/off switch is located on the machine.  Follow instructions from your health care provider about how to set the pressure on your machine and when you should use it.  Do not eat or drink while the CPAP or BPAP machine is on. Food or fluids could get pushed into your lungs by the pressure of the CPAP or BPAP.  Do not smoke. Tobacco smoke residue can damage the machine.  For home use, CPAP and BPAP machines can be rented or purchased through home  health care companies. Many different brands of machines are available. Renting a machine before purchasing may help you find out which particular machine works well for you.  Keep the CPAP or BPAP machine and attachments clean. Ask your health care provider for specific instructions. Get help right away if:  You have redness or open areas around your nose or mouth where the mask fits.  You have trouble using the CPAP or BPAP machine.  You cannot tolerate wearing the CPAP or BPAP mask.  You have pain, discomfort, and bloating in your abdomen. Summary  CPAP and BPAP are methods of helping a person breathe with the use of air pressure.  Both CPAP and BPAP are provided by a small machine with a flexible plastic tube that attaches to a plastic mask.  If you have trouble with the mask, it is very important that you talk with your health care provider about finding a way to make the mask easier to tolerate. This information is not intended to replace advice given to you by your health care provider. Make sure you discuss any questions you have with your health care provider. Document Revised: 03/04/2019 Document Reviewed: 10/01/2016 Elsevier Patient Education  2020 [employer].

## 2020-10-04 NOTE — Assessment & Plan Note (Addendum)
Patients reports waking up several times gasping for air. She wears nasal pillow mask and is a mouth breather. Recommend changing CPAP pressure from 7 to 8cm h20 and adding chin strap. Requesting updated compliance report. FU in 1 month to review.

## 2020-11-07 ENCOUNTER — Ambulatory Visit (INDEPENDENT_AMBULATORY_CARE_PROVIDER_SITE_OTHER): Payer: Medicare Other | Admitting: Primary Care

## 2020-11-07 ENCOUNTER — Encounter: Payer: Self-pay | Admitting: Primary Care

## 2020-11-07 DIAGNOSIS — G4733 Obstructive sleep apnea (adult) (pediatric): Secondary | ICD-10-CM

## 2020-11-07 NOTE — Progress Notes (Signed)
Virtual Visit via Telephone Note  I connected with Reginia Forts on 11/07/20 at  9:00 AM EST by telephone and verified that I am speaking with the correct person using two identifiers.  Location: Patient: Home Provider: Office   I discussed the limitations, risks, security and privacy concerns of performing an evaluation and management service by telephone and the availability of in person appointments. I also discussed with the patient that there may be a patient responsible charge related to this service. The patient expressed understanding and agreed to proceed.   History of Present Illness:  69 year old female, never smoked. PMH significant for hypertension, obstructive sleep apnea, GERD.  Patient of Dr. Vassie Loll, last seen in office on 09/18/2019. Diagnosed with sleep apnea in 2008, maintained on CPAP at 7 cm H2O with small nasal mask.  History of gastric bypass in 2010, peak weight 288 lbs. DME company is [employer].   Previous [facility] pulmonary encounter: 10/04/2020 Patient presents today for a 1 year follow-up OSA on CPAP. She is compliant with CPAP use, having some trouble recently. States that on four separate occasions she has woken up feeling short of breath/gasping for air. She is more tired. She wears nasal pillow mask. She is cleaning and changing out mask once a month. She does sleep with her mouth open.   [employer] download 07/25/18-08/23/18 30/30 days used (100%); 28 days (93%) > 4 hours Average usage days used 7 hours 38 mins Pressure 7cm h20 Airleaks 20.2L/min AHI 1.6   11/07/2020 - Interim hx  Patient contacted today for 1 month follow-up. During last visit she reports waking up on four separate occasional feeling short of breath/gasping for air. We increased her pressure to 8cm h20 and added a chin strap as she is a mouth breather. She is sleeping better. She has had one occurrence of waking up coughing at night. She brought her machine in to [employer] and they increased CPAP pressure  to 8cm. She did not get a chin strap but feels she would benefit from one. She recently changed out her nasal mask for a new one. 2   Observations/Objective:  - Able to speak in full sentences; no overt shortness of breath, wheezing or cough  Assessment and Plan:  OSA: - Patient reports compliance with CPAP, increased pressure has been beneficial  - Current pressure 8cm h20  - Patient needs to bring SD card to [facility] for download; faxed to [phone] - DME order for chin strap - Continue to wear CPAP for 4-6 hours or more each night, do not drive if drowsy   GERD: - She has had one occurance of nocturnal coughing likely from GERD  - She is compliant with Famotidine 40mg  at bedtime  - Advised she take omeprazole 40mg  as needed for reflux and follow-up GERD diet   Follow Up Instructions:   - 6 months with or sooner if neded  I discussed the assessment and treatment plan with the patient. The patient was provided an opportunity to ask questions and all were answered. The patient agreed with the plan and demonstrated an understanding of the instructions.   The patient was advised to call back or seek an in-person evaluation if the symptoms worsen or if the condition fails to improve as anticipated.  I provided 18 minutes of non-face-to-face time during this encounter.   , NP

## 2021-01-14 ENCOUNTER — Other Ambulatory Visit: Payer: Self-pay

## 2021-01-14 ENCOUNTER — Emergency Department
Admission: EM | Admit: 2021-01-14 | Discharge: 2021-01-14 | Disposition: A | Discharge status: Home / Self Care | Payer: Medicare Other | Attending: Emergency Medicine | Admitting: Emergency Medicine

## 2021-01-14 DIAGNOSIS — Z79899 Other long term (current) drug therapy: Secondary | ICD-10-CM | POA: Insufficient documentation

## 2021-01-14 DIAGNOSIS — I10 Essential (primary) hypertension: Secondary | ICD-10-CM | POA: Insufficient documentation

## 2021-01-14 DIAGNOSIS — Z96651 Presence of right artificial knee joint: Secondary | ICD-10-CM | POA: Insufficient documentation

## 2021-01-14 LAB — COMPREHENSIVE METABOLIC PANEL
ALT: 20 U/L (ref 0–44)
AST: 23 U/L (ref 15–41)
Albumin: 4 g/dL (ref 3.5–5.0)
Alkaline Phosphatase: 122 U/L (ref 38–126)
Anion gap: 8 (ref 5–15)
BUN: 23 mg/dL (ref 8–23)
CO2: 25 mmol/L (ref 22–32)
Calcium: 9.1 mg/dL (ref 8.9–10.3)
Chloride: 106 mmol/L (ref 98–111)
Creatinine, Ser: 1 mg/dL (ref 0.44–1.00)
GFR, Estimated: >60 mL/min (ref >60)
Glucose, Bld: 125 mg/dL — ABNORMAL HIGH (ref 70–99)
Potassium: 3.8 mmol/L (ref 3.5–5.1)
Sodium: 139 mmol/L (ref 135–145)
Total Bilirubin: 0.7 mg/dL (ref 0.3–1.2)
Total Protein: 7.5 g/dL (ref 6.5–8.1)

## 2021-01-14 LAB — CBC
HCT: 40.9 % (ref 36.0–46.0)
Hemoglobin: 13.4 g/dL (ref 12.0–15.0)
MCH: 30.3 pg (ref 26.0–34.0)
MCHC: 32.8 g/dL (ref 30.0–36.0)
MCV: 92.5 fL (ref 80.0–100.0)
Platelets: 268 10*3/uL (ref 150–400)
RBC: 4.42 MIL/uL (ref 3.87–5.11)
RDW: 14.3 % (ref 11.5–15.5)
WBC: 7.7 10*3/uL (ref 4.0–10.5)
nRBC: 0 % (ref 0.0–0.2)

## 2021-01-14 LAB — TROPONIN I (HIGH SENSITIVITY): Troponin I (High Sensitivity): 4 ng/L (ref <18)

## 2021-01-14 NOTE — ED Provider Notes (Signed)
[facility] Emergency Department Provider Note  ____________________________________________  Time seen: Approximately 10:22 PM  I have reviewed the triage vital signs and the nursing notes.   HISTORY  Chief Complaint Hypertension    HPI Cindy Anthony is a 70 y.o. female with a history of GERD, hypertension, obesity, heel spurs who comes the ED today due to elevated blood pressure.  She did have some feeling of dizziness as well which has resolved.  She notes that she has been in her usual state of health, no changes in her medications except for the addition of meloxicam due to heel spurs.  She also uses Voltaren gel on the heels, takes a turmeric supplement as well.  Denies chest pain shortness of breath vision changes numbness tingling or weakness.  No headache.  No neck pain.  No palpitations or syncope.      Past Medical History:  Diagnosis Date  . Anxiety   . Arthritis    OSTEOARTHRITIS  . Benign paroxysmal positional vertigo   . Depression   . GERD (gastroesophageal reflux disease)   . Hemorrhoids   . History of chicken pox   . Hyperlipidemia   . Hypertension   . Piriformis syndrome of both sides   . Sleep apnea      Patient Active Problem List   Diagnosis Date Noted  . Chronic mesenteric ischemia (HCC) 07/25/2015  . Bacteriuria with pyuria 07/03/2015  . Left flank pain, chronic 06/30/2015  . Essential hypertension 05/12/2015  . Morbid obesity (HCC) 03/03/2015  . Hip pain 03/03/2015  . GERD (gastroesophageal reflux disease) 03/03/2015  . OSA (obstructive sleep apnea) 03/03/2015     Past Surgical History:  Procedure Laterality Date  . ABDOMINAL HYSTERECTOMY    . BARIATRIC SURGERY  2013   sleeve  . COLONOSCOPY WITH PROPOFOL N/A 11/28/2018   Procedure: COLONOSCOPY WITH PROPOFOL;  Surgeon: Scot Jun, MD;  Location: [facility] ENDOSCOPY;  Service: Endoscopy;  Laterality: N/A;  . ESOPHAGOGASTRODUODENOSCOPY (EGD) WITH PROPOFOL N/A  11/28/2018   Procedure: ESOPHAGOGASTRODUODENOSCOPY (EGD) WITH PROPOFOL;  Surgeon: Scot Jun, MD;  Location: [facility] ENDOSCOPY;  Service: Endoscopy;  Laterality: N/A;  . JOINT REPLACEMENT    . SHOULDER ARTHROSCOPY Right 2007  . TOTAL KNEE ARTHROPLASTY Right 2009     Prior to Admission medications   Medication Sig Start Date End Date Taking? Authorizing Provider  famotidine (PEPCID) 40 MG tablet Take 40 mg by mouth daily.    [provider]  fluticasone (FLONASE) 50 MCG/ACT nasal spray Place 1 spray into both nostrils daily. Patient not taking: No sig reported    [provider]  irbesartan-hydrochlorothiazide (AVALIDE) 150-12.5 MG tablet Take 1 tablet by mouth daily.    [provider]  irbesartan-hydrochlorothiazide (AVALIDE) 300-12.5 MG tablet Take by mouth. 09/17/19 09/16/20  [provider]  Lidocaine 2 % GEL Apply 1 application topically 3 (three) times daily as needed. Patient not taking: Reported on 11/07/2020 07/23/18   Cuthriell, Delorise Royals, PA-C  LORazepam (ATIVAN) 0.5 MG tablet Take 0.5 mg by mouth every 8 (eight) hours as needed for anxiety. Patient not taking: Reported on 11/07/2020    [provider]  losartan-hydrochlorothiazide (HYZAAR) 50-12.5 MG tablet Take 1 tablet by mouth daily. Patient not taking: No sig reported    [provider]  nortriptyline (PAMELOR) 10 MG capsule Take 10 mg by mouth at bedtime.    [provider]  omeprazole (PRILOSEC) 40 MG capsule Take 40 mg by mouth daily as needed (acid  reflux).  Patient not taking: No sig reported    [provider]  traMADol (ULTRAM) 50 MG tablet Take by mouth as needed. Patient not taking: No sig reported    [provider]     Allergies Amlodipine, Lisinopril, Other, and Shellfish-derived products   Family History  Problem Relation Age of Onset  . Arthritis Mother   . Hyperlipidemia Mother   . Heart disease Mother   . Stroke  Mother   . Hypertension Mother   . Kidney disease Mother   . Diabetes Mother   . Colon polyps Mother   . Heart attack Mother   . Arthritis Father   . Heart disease Father   . Diabetes Paternal Grandmother   . Hyperlipidemia Sister   . Hypertension Brother     Social History Social History   Tobacco Use  . Smoking status: Never Smoker  . Smokeless tobacco: Never Used  Vaping Use  . Vaping Use: Never used  Substance Use Topics  . Alcohol use: No    Alcohol/week: 0.0 standard drinks  . Drug use: No    Review of Systems  Constitutional:   No fever or chills.  ENT:   No sore throat. No rhinorrhea. Cardiovascular:   No chest pain or syncope. Respiratory:   No dyspnea or cough. Gastrointestinal:   Negative for abdominal pain, vomiting and diarrhea.  Musculoskeletal:   Chronic bilateral foot pain All other systems reviewed and are negative except as documented above in ROS and HPI.  ____________________________________________   PHYSICAL EXAM:  VITAL SIGNS: ED Triage Vitals [01/14/21 2017]  Enc Vitals Group     BP (!) 173/73     Pulse Rate 65     Resp 17     Temp 98.1 F (36.7 C)     Temp Source Oral     SpO2 100 %     Weight 270 lb (122.5 kg)     Height 5\' 4"  (1.626 m)     Head Circumference      Peak Flow      Pain Score 0     Pain Loc      Pain Edu?      Excl. in GC?     Vital signs reviewed, nursing assessments reviewed.   Constitutional:   Alert and oriented. Non-toxic appearance. Eyes:   Conjunctivae are normal. EOMI. ENT      Head:   Normocephalic and atraumatic.      Mouth/Throat:   MMM      Neck:   No meningismus. Full ROM. Cardiovascular:   RRR.  Normal pulse cap refill less than 2 seconds. Respiratory:   Normal respiratory effort without tachypnea/retractions.  Musculoskeletal:   Normal range of motion in all extremities.  No edema.  No foot wounds or inflammatory changes Neurologic:   Normal speech and language.  Motor grossly  intact. No acute focal neurologic deficits are appreciated.  Skin:    Skin is warm, dry and intact. No rash noted.  No wounds.  ____________________________________________    LABS (pertinent positives/negatives) (all labs ordered are listed, but only abnormal results are displayed) Labs Reviewed  COMPREHENSIVE METABOLIC PANEL - Abnormal; Notable for the following components:      Result Value   Glucose, Bld 125 (*)    All other components within normal limits  CBC  TROPONIN I (HIGH SENSITIVITY)  TROPONIN I (HIGH SENSITIVITY)   ____________________________________________   EKG  ____________________________________________    RADIOLOGY  No results found.  ____________________________________________   PROCEDURES Procedures  ____________________________________________  CLINICAL IMPRESSION / ASSESSMENT AND PLAN / ED COURSE  Pertinent labs & imaging results that were available during my care of the patient were reviewed by me and considered in my medical decision making (see chart for details).  Cindy Anthony was evaluated in Emergency Department on 01/14/2021 for the symptoms described in the history of present illness. She was evaluated in the context of the global COVID-19 pandemic, which necessitated consideration that the patient might be at risk for infection with the SARS-CoV-2 virus that causes COVID-19. Institutional protocols and algorithms that pertain to the evaluation of patients at risk for COVID-19 are in a state of rapid change based on information released by regulatory bodies including the CDC and federal and state organizations. These policies and algorithms were followed during the patient's care in the ED.   Patient comes the ED due to hypertension, possibly symptomatic with dizziness.  Symptoms have resolved.  Blood pressure has come down some on its own to 150/66 in the treatment room.  Last took the meloxicam this morning.  It appears the meloxicam is  causing worsening hypertension due to renovascular phenomenon.  Recommended discontinuing it, she can continue her other medications.  Suggested trying CBD oil topically on the painful areas of her foot.  She'll substitute the meloxicam with Tylenol for now.  Stable for discharge      ____________________________________________   FINAL CLINICAL IMPRESSION(S) / ED DIAGNOSES    Final diagnoses:  Hypertension, unspecified type     ED Discharge Orders    None      Portions of this note were generated with dragon dictation software. Dictation errors may occur despite best attempts at proofreading.   Sharman Cheek, MD 01/14/21 2225

## 2021-01-14 NOTE — ED Notes (Signed)
Pt reports dizziness all day. Prompted her to take blood pressure which was found to be 190's systolic. Dizziness resolved PTA

## 2021-01-14 NOTE — ED Triage Notes (Signed)
Pt c/o hypertension (198/57) today and dizziness that started earlier today. Pt states she's taking BP meds as prescribed but meloxicam was added which she thinks is causing her BP to increase. Pt denies vision changes, denies N/V/D.

## 2021-03-27 ENCOUNTER — Other Ambulatory Visit: Payer: Self-pay

## 2021-03-27 ENCOUNTER — Emergency Department
Admission: EM | Admit: 2021-03-27 | Discharge: 2021-03-27 | Disposition: A | Discharge status: Home / Self Care | Payer: Medicare Other | Attending: Emergency Medicine | Admitting: Emergency Medicine

## 2021-03-27 ENCOUNTER — Encounter: Payer: Self-pay | Admitting: Emergency Medicine

## 2021-03-27 DIAGNOSIS — T887XXA Unspecified adverse effect of drug or medicament, initial encounter: Secondary | ICD-10-CM

## 2021-03-27 DIAGNOSIS — R6883 Chills (without fever): Secondary | ICD-10-CM | POA: Diagnosis not present

## 2021-03-27 DIAGNOSIS — R5381 Other malaise: Secondary | ICD-10-CM | POA: Insufficient documentation

## 2021-03-27 DIAGNOSIS — Z79899 Other long term (current) drug therapy: Secondary | ICD-10-CM | POA: Insufficient documentation

## 2021-03-27 DIAGNOSIS — Z96651 Presence of right artificial knee joint: Secondary | ICD-10-CM | POA: Diagnosis not present

## 2021-03-27 DIAGNOSIS — R110 Nausea: Secondary | ICD-10-CM | POA: Diagnosis present

## 2021-03-27 DIAGNOSIS — T50905A Adverse effect of unspecified drugs, medicaments and biological substances, initial encounter: Secondary | ICD-10-CM | POA: Insufficient documentation

## 2021-03-27 DIAGNOSIS — I10 Essential (primary) hypertension: Secondary | ICD-10-CM | POA: Diagnosis not present

## 2021-03-27 DIAGNOSIS — R197 Diarrhea, unspecified: Secondary | ICD-10-CM | POA: Insufficient documentation

## 2021-03-27 LAB — COMPREHENSIVE METABOLIC PANEL WITH GFR
ALT: 16 U/L (ref 0–44)
AST: 22 U/L (ref 15–41)
Albumin: 4.2 g/dL (ref 3.5–5.0)
Alkaline Phosphatase: 77 U/L (ref 38–126)
Anion gap: 9 (ref 5–15)
BUN: 15 mg/dL (ref 8–23)
CO2: 24 mmol/L (ref 22–32)
Calcium: 9.1 mg/dL (ref 8.9–10.3)
Chloride: 102 mmol/L (ref 98–111)
Creatinine, Ser: 0.83 mg/dL (ref 0.44–1.00)
GFR, Estimated: >60 mL/min (ref >60)
Glucose, Bld: 91 mg/dL (ref 70–99)
Potassium: 4.1 mmol/L (ref 3.5–5.1)
Sodium: 135 mmol/L (ref 135–145)
Total Bilirubin: 1 mg/dL (ref 0.3–1.2)
Total Protein: 7.7 g/dL (ref 6.5–8.1)

## 2021-03-27 LAB — CBC WITH DIFFERENTIAL/PLATELET
Abs Immature Granulocytes: 0.04 10*3/uL (ref 0.00–0.07)
Basophils Absolute: 0 10*3/uL (ref 0.0–0.1)
Basophils Relative: 0 %
Eosinophils Absolute: 0 10*3/uL (ref 0.0–0.5)
Eosinophils Relative: 0 %
HCT: 43.9 % (ref 36.0–46.0)
Hemoglobin: 14.4 g/dL (ref 12.0–15.0)
Immature Granulocytes: 0 %
Lymphocytes Relative: 14 %
Lymphs Abs: 1.3 10*3/uL (ref 0.7–4.0)
MCH: 30.2 pg (ref 26.0–34.0)
MCHC: 32.8 g/dL (ref 30.0–36.0)
MCV: 92 fL (ref 80.0–100.0)
Monocytes Absolute: 0.5 10*3/uL (ref 0.1–1.0)
Monocytes Relative: 6 %
Neutro Abs: 7.7 10*3/uL (ref 1.7–7.7)
Neutrophils Relative %: 80 %
Platelets: 299 10*3/uL (ref 150–400)
RBC: 4.77 MIL/uL (ref 3.87–5.11)
RDW: 14.6 % (ref 11.5–15.5)
WBC: 9.7 10*3/uL (ref 4.0–10.5)
nRBC: 0 % (ref 0.0–0.2)

## 2021-03-27 MED ORDER — LOPERAMIDE HCL 2 MG PO TABS: 2.0000 mg | ORAL_TABLET | Freq: Four times a day (QID) | ORAL | 0 refills | Status: DC | PRN

## 2021-03-27 NOTE — ED Triage Notes (Signed)
Pt to ED via POV with c/o nausea and chills. Pt states started new medication on Saturday. Pt states started [facility] on Saturday and since then has had the nausea. Pt A&O x4, NAD noted at thist ime. Pt also c/o 3 episodes of diarrhea.

## 2021-03-27 NOTE — ED Provider Notes (Signed)
[facility] Emergency Department Provider Note  ____________________________________________  Time seen: Approximately 4:50 PM  I have reviewed the triage vital signs and the nursing notes.   HISTORY  Chief Complaint Nausea and Chills    HPI Cindy Anthony is a 70 y.o. female who presents the emergency department for evaluation for nausea, malaise, diarrhea.  Patient states that she started [facility] a weight control medication 2 days ago.  It is an injectable weekly medicine.  Patient took the first dose and today started feeling unwell.  She has had some nausea, diarrhea, aches, chills.  No fever.  She denies any pain to include chest pain or abdominal pain.  Patient is unsure whether she is come down with an illness versus side effect of the medication.  Patient was prescribed Zofran from her primary care doctor informing them of her symptoms but presented to the ED for evaluation prior to starting this medicine.         Past Medical History:  Diagnosis Date  . Anxiety   . Arthritis    OSTEOARTHRITIS  . Benign paroxysmal positional vertigo   . Depression   . GERD (gastroesophageal reflux disease)   . Hemorrhoids   . History of chicken pox   . Hyperlipidemia   . Hypertension   . Piriformis syndrome of both sides   . Sleep apnea     Patient Active Problem List   Diagnosis Date Noted  . Chronic mesenteric ischemia (HCC) 07/25/2015  . Bacteriuria with pyuria 07/03/2015  . Left flank pain, chronic 06/30/2015  . Essential hypertension 05/12/2015  . Morbid obesity (HCC) 03/03/2015  . Hip pain 03/03/2015  . GERD (gastroesophageal reflux disease) 03/03/2015  . OSA (obstructive sleep apnea) 03/03/2015    Past Surgical History:  Procedure Laterality Date  . ABDOMINAL HYSTERECTOMY    . BARIATRIC SURGERY  2013   sleeve  . COLONOSCOPY WITH PROPOFOL N/A 11/28/2018   Procedure: COLONOSCOPY WITH PROPOFOL;  Surgeon: Scot Jun, MD;  Location: [facility]  ENDOSCOPY;  Service: Endoscopy;  Laterality: N/A;  . ESOPHAGOGASTRODUODENOSCOPY (EGD) WITH PROPOFOL N/A 11/28/2018   Procedure: ESOPHAGOGASTRODUODENOSCOPY (EGD) WITH PROPOFOL;  Surgeon: Scot Jun, MD;  Location[facility] ENDOSCOPY;  Service: Endoscopy;  Laterality: N/A;  . JOINT REPLACEMENT    . SHOULDER ARTHROSCOPY Right 2007  . TOTAL KNEE ARTHROPLASTY Right 2009    Prior to Admission medications   Medication Sig Start Date End Date Taking? Authorizing Provider  loperamide (IMODIUM A-D) 2 MG tablet Take 1 tablet (2 mg total) by mouth 4 (four) times daily as needed for diarrhea or loose stools. Take 4 mg (2 capsules) orally followed by 2 mg (1 capsule) after each unformed stool; MAX 16 mg (8 capsules) per day 03/27/21  Yes Tifanny Dollens, Delorise Royals, PA-C  famotidine (PEPCID) 40 MG tablet Take 40 mg by mouth daily.    [provider]  fluticasone (FLONASE) 50 MCG/ACT nasal spray Place 1 spray into both nostrils daily. Patient not taking: No sig reported    [provider]  irbesartan-hydrochlorothiazide (AVALIDE) 150-12.5 MG tablet Take 1 tablet by mouth daily.    [provider]  irbesartan-hydrochlorothiazide (AVALIDE) 300-12.5 MG tablet Take by mouth. 09/17/19 09/16/20  [provider]  Lidocaine 2 % GEL Apply 1 application topically 3 (three) times daily as needed. Patient not taking: Reported on 11/07/2020 07/23/18   Marua Qin, Delorise Royals, PA-C  LORazepam (ATIVAN) 0.5 MG tablet Take 0.5 mg by mouth every 8 (eight) hours as needed for anxiety.  Patient not taking: Reported on 11/07/2020    [provider]  losartan-hydrochlorothiazide (HYZAAR) 50-12.5 MG tablet Take 1 tablet by mouth daily. Patient not taking: No sig reported    [provider]  nortriptyline (PAMELOR) 10 MG capsule Take 10 mg by mouth at bedtime.    [provider]  omeprazole (PRILOSEC) 40 MG capsule Take 40 mg by mouth daily as needed (acid reflux).  Patient not  taking: No sig reported    [provider]  traMADol (ULTRAM) 50 MG tablet Take by mouth as needed. Patient not taking: No sig reported    [provider]    Allergies Amlodipine, Lisinopril, Other, and Shellfish-derived products  Family History  Problem Relation Age of Onset  . Arthritis Mother   . Hyperlipidemia Mother   . Heart disease Mother   . Stroke Mother   . Hypertension Mother   . Kidney disease Mother   . Diabetes Mother   . Colon polyps Mother   . Heart attack Mother   . Arthritis Father   . Heart disease Father   . Diabetes Paternal Grandmother   . Hyperlipidemia Sister   . Hypertension Brother     Social History Social History   Tobacco Use  . Smoking status: Never Smoker  . Smokeless tobacco: Never Used  Vaping Use  . Vaping Use: Never used  Substance Use Topics  . Alcohol use: No    Alcohol/week: 0.0 standard drinks  . Drug use: No     Review of Systems  Constitutional: No fever/chills Eyes: No visual changes. No discharge ENT: No upper respiratory complaints. Cardiovascular: no chest pain. Respiratory: no cough. No SOB. Gastrointestinal: No abdominal pain.  Positive for nausea and diarrhea.  No constipation. Musculoskeletal: Negative for musculoskeletal pain. Skin: Negative for rash, abrasions, lacerations, ecchymosis. Neurological: Negative for headaches, focal weakness or numbness.  10 System ROS otherwise negative.  ____________________________________________   PHYSICAL EXAM:  VITAL SIGNS: ED Triage Vitals  Enc Vitals Group     BP 03/27/21 1526 (!) 150/64     Pulse Rate 03/27/21 1526 60     Resp 03/27/21 1526 20     Temp 03/27/21 1526 98.3 F (36.8 C)     Temp Source 03/27/21 1526 Oral     SpO2 03/27/21 1526 99 %     Weight 03/27/21 1525 274 lb (124.3 kg)     Height 03/27/21 1525 5\' 3"  (1.6 m)     Head Circumference --      Peak Flow --      Pain Score 03/27/21 1524 0     Pain Loc --      Pain Edu? --       Excl. in GC? --      Constitutional: Alert and oriented. Well appearing and in no acute distress. Eyes: Conjunctivae are normal. PERRL. EOMI. Head: Atraumatic. ENT:      Ears:       Nose: No congestion/rhinnorhea.      Mouth/Throat: Mucous membranes are moist.  Neck: No stridor.    Cardiovascular: Normal rate, regular rhythm. Normal S1 and S2.  Good peripheral circulation. Respiratory: Normal respiratory effort without tachypnea or retractions. Lungs CTAB. Good air entry to the bases with no decreased or absent breath sounds. Gastrointestinal: Bowel sounds 4 quadrants. Soft and nontender to palpation. No guarding or rigidity. No palpable masses. No distention. No CVA tenderness. Musculoskeletal: Full range of motion to all extremities. No gross deformities appreciated. Neurologic:  Normal speech  and language. No gross focal neurologic deficits are appreciated.  Skin:  Skin is warm, dry and intact. No rash noted. Psychiatric: Mood and affect are normal. Speech and behavior are normal. Patient exhibits appropriate insight and judgement.   ____________________________________________   LABS (all labs ordered are listed, but only abnormal results are displayed)  Labs Reviewed  CBC WITH DIFFERENTIAL/PLATELET  COMPREHENSIVE METABOLIC PANEL  URINALYSIS, COMPLETE (UACMP) WITH MICROSCOPIC   ____________________________________________  EKG   ____________________________________________  RADIOLOGY   No results found.  ____________________________________________    PROCEDURES  Procedure(s) performed:    Procedures    Medications - No data to display   ____________________________________________   INITIAL IMPRESSION / ASSESSMENT AND PLAN / ED COURSE  Pertinent labs & imaging results that were available during my care of the patient were reviewed by me and considered in my medical decision making (see chart for details).  Review of the [place] CSRS was  performed in accordance of the [facility] prior to dispensing any controlled drugs.           Patient's diagnosis is consistent with medication side effect.  Patient presented to emergency department complaining of chills, nausea, diarrhea after starting a new weight loss medication Saturday.  Patient is taking Wegovy and started the medicine 2 days ago.  Today she had symptoms.  She has had 3 episodes of nonbloody diarrhea.  Patient states that nausea started after taking injected medication.  She contacted her primary care and they prescribe Zofran that she was unsure whether there may be something else also going on presents to the ED for evaluation.  Overall exam is reassuring.  No acute findings.  Given the side effects of the prescription and her symptoms I feel that this is likely side effect versus a viral illness.  We discussed symptom control medications and whether the patient would like to continue this new prescription.  I do not feel that there is any indication of allergic reaction, again I feel this is likely side effects.  Patient may also discuss side effect profile and continuing the medication with the original prescriber.  Patient is amenable to this plan.  Return precautions discussed with the patient.  Patient has a prescription for Zofran and I will prescribe loperamide.   Patient is given ED precautions to return to the ED for any worsening or new symptoms.     ____________________________________________  FINAL CLINICAL IMPRESSION(S) / ED DIAGNOSES  Final diagnoses:  Medication side effect      NEW MEDICATIONS STARTED DURING THIS VISIT:  ED Discharge Orders         Ordered    loperamide (IMODIUM A-D) 2 MG tablet  4 times daily PRN        03/27/21 1712              This chart was dictated using voice recognition software/Dragon. Despite best efforts to proofread, errors can occur which can change the meaning. Any change was purely unintentional.     Racheal Patches, PA-C 03/27/21 1800    Merwyn Katos, MD 03/27/21 [phone]

## 2021-04-25 ENCOUNTER — Telehealth: Payer: Self-pay | Admitting: Pulmonary Disease

## 2021-04-25 NOTE — Telephone Encounter (Signed)
Called and spoke with patient. She was ok with scheduling an OV. She has been scheduled for a F/U with Beth on 04/28/21. She wasn't sure if she had a SD card in her machine so she will bring the machine with her to her appt.   Nothing further needed at time of call.

## 2021-04-25 NOTE — Telephone Encounter (Signed)
Yes please. She is due for a follow-up in June. Please schedule with Vassie Loll or me

## 2021-04-25 NOTE — Telephone Encounter (Signed)
Called and spoke to pt. Pt states she is waking up feeling like she is choking/suffocating during the middle of the night. Pt states has to remove the CPAP at that time. Pt states when she was seen by [facility] in 09/2020 she was experiencing the same issues but then her pressure was increased to 8cm. Pt has only just recently began to feel the same sensation, lasting for about a month. Pt brought her CPAP to [facility] in 10/2020 as requested by [facility] but I do not see this download.   Beth, please advise if you would like a new DL before advising on recs.

## 2021-04-27 NOTE — Progress Notes (Signed)
@Patient  ID: , female    DOB: 15-Apr-1951, 70 y.o.   MRN: [id number]  Chief Complaint  Patient presents with  . Follow-up    Pt said that she has been feeling like she has been choking, gasping for air, coughing while wearing the cpap and is not sure if she wants to still wearing it. Pt wants to know if there is an alternative to the cpap.    Referring provider: [id number], MD  HPI: 70 year old female, never smoked. PMH significant for hypertension, obstructive sleep apnea, GERD.  Patient of Dr. 66. Diagnosed with sleep apnea in 2008, Cpap titration study in 2010 she had 22 events/hour supine position. Maintained on CPAP at 7 cm H2O with small nasal mask.  History of gastric bypass in 2010, peak weight 288 lbs. DME company is [facility].   Previous [facility] pulmonary encounter: 10/04/2020 Patient presents today for a 1 year follow-up OSA on CPAP. She is compliant with CPAP use, having some trouble recently. States that on four separate occasions she has woken up feeling short of breath/gasping for air. She is more tired. She wears nasal pillow mask. She is cleaning and changing out mask once a month. She does sleep with her mouth open.   Airview download 07/25/18-08/23/18 30/30 days used (100%); 28 days (93%) > 4 hours Average usage days used 7 hours 38 mins Pressure 7cm h20 Airleaks 20.2L/min AHI 1.6   11/07/2020  Patient contacted today for 1 month follow-up. During last visit she reports waking up on four separate occasional feeling short of breath/gasping for air. We increased her pressure to 8cm h20 and added a chin strap as she is a mouth breather. She is sleeping better. She has had one occurrence of waking up coughing at night. She brought her machine in to [facility] and they increased CPAP pressure to 8cm. She did not get a chin strap but feels she would benefit from one. She recently changed out her nasal mask for a new one. 2  04/28/2021- Interim hx  Patient presents today  for 6 month follow-up OSA. During last visit we change her CPAP pressure to 8cm h20 and placed an order for chin strap. She is still experiencing periods of choking and coughing at night, she wakes up on occasional feeling like she can not breath. She wears nasal pillow mask. Unable to assess download. She gets approx 8-9 hours of sleep a night. She goes to sleep around 9pm and gets out of bed at 5am. She wakes up 2 times at night and is able to go right back to sleep. She sleeps with TV. Denies overt reflux symptoms.     Allergies  Allergen Reactions  . Amlodipine Diarrhea  . Lisinopril Other (See Comments)    Dizziness  . Other Other (See Comments)  . Shellfish-Derived Products Other (See Comments)    Immunization History  Administered Date(s) Administered  . Fluad Quad(high Dose 65+) 08/26/2020  . Influenza,inj,Quad PF,6+ Mos 11/26/2014, 08/26/2015  . Influenza-Unspecified 08/24/2016  . PFIZER(Purple Top)SARS-COV-2 Vaccination 01/16/2020, 02/07/2020, 09/16/2020, 04/18/2021  . Pneumococcal Conjugate-13 03/20/2018  . Pneumococcal Polysaccharide-23 05/20/2019  . Tdap 11/26/2010  . Varicella 10/17/2011  . Zoster Recombinat (Shingrix) 11/30/2020, 02/24/2021  . Zoster, Live 04/26/2014    Past Medical History:  Diagnosis Date  . Anxiety   . Arthritis    OSTEOARTHRITIS  . Benign paroxysmal positional vertigo   . Depression   . GERD (gastroesophageal reflux disease)   . Hemorrhoids   . History of  chicken pox   . Hyperlipidemia   . Hypertension   . Piriformis syndrome of both sides   . Sleep apnea     Tobacco History: Social History   Tobacco Use  Smoking Status Never Smoker  Smokeless Tobacco Never Used   Counseling given: Not Answered   Outpatient Medications Prior to Visit  Medication Sig Dispense Refill  . famotidine (PEPCID) 40 MG tablet Take 40 mg by mouth daily.    . irbesartan (AVAPRO) 300 MG tablet Take 300 mg by mouth at bedtime.    . Lidocaine 2 % GEL  Apply 1 application topically 3 (three) times daily as needed. 30 g 1  . metFORMIN (GLUCOPHAGE) 500 MG tablet Take by mouth.    . spironolactone (ALDACTONE) 25 MG tablet Take 1 tablet by mouth daily.    . irbesartan-hydrochlorothiazide (AVALIDE) 300-12.5 MG tablet Take 1 tablet by mouth daily.    Marland Kitchen LORazepam (ATIVAN) 0.5 MG tablet Take 0.5 mg by mouth every 8 (eight) hours as needed for anxiety. (Patient not taking: No sig reported)    . nortriptyline (PAMELOR) 10 MG capsule Take 10 mg by mouth at bedtime. (Patient not taking: Reported on 04/28/2021)    . fluticasone (FLONASE) 50 MCG/ACT nasal spray Place 1 spray into both nostrils daily. (Patient not taking: No sig reported)    . irbesartan-hydrochlorothiazide (AVALIDE) 150-12.5 MG tablet Take 1 tablet by mouth daily.    . irbesartan-hydrochlorothiazide (AVALIDE) 300-12.5 MG tablet Take by mouth.    . loperamide (IMODIUM A-D) 2 MG tablet Take 1 tablet (2 mg total) by mouth 4 (four) times daily as needed for diarrhea or loose stools. Take 4 mg (2 capsules) orally followed by 2 mg (1 capsule) after each unformed stool; MAX 16 mg (8 capsules) per day 30 tablet 0  . losartan-hydrochlorothiazide (HYZAAR) 50-12.5 MG tablet Take 1 tablet by mouth daily. (Patient not taking: No sig reported)    . omeprazole (PRILOSEC) 40 MG capsule Take 40 mg by mouth daily as needed (acid reflux).  (Patient not taking: No sig reported)    . traMADol (ULTRAM) 50 MG tablet Take by mouth as needed. (Patient not taking: No sig reported)     No facility-administered medications prior to visit.    Review of Systems  Review of Systems  Constitutional: Negative.   Respiratory: Negative.   Psychiatric/Behavioral: Positive for sleep disturbance.    Physical Exam  BP 120/70 (BP Location: Left Wrist, Patient Position: Sitting, Cuff Size: Normal)   Pulse 60   Ht 5\' 3"  (1.6 m)   Wt 269 lb 6.4 oz (122.2 kg)   SpO2 100% Comment: RA  BMI 47.72 kg/m  Physical  Exam Constitutional:      Appearance: Normal appearance.  HENT:     Head: Normocephalic and atraumatic.     Mouth/Throat:     Mouth: Mucous membranes are moist.     Pharynx: Oropharynx is clear.  Cardiovascular:     Rate and Rhythm: Normal rate and regular rhythm.  Pulmonary:     Effort: Pulmonary effort is normal.     Breath sounds: Normal breath sounds.  Musculoskeletal:        General: Normal range of motion.  Skin:    General: Skin is warm and dry.  Neurological:     General: No focal deficit present.     Mental Status: She is alert and oriented to person, place, and time. Mental status is at baseline.  Psychiatric:  Mood and Affect: Mood normal.        Behavior: Behavior normal.        Thought Content: Thought content normal.        Judgment: Judgment normal.      Lab Results:  CBC    Component Value Date/Time   WBC 9.7 03/27/2021 1525   RBC 4.77 03/27/2021 1525   HGB 14.4 03/27/2021 1525   HCT 43.9 03/27/2021 1525   PLT 299 03/27/2021 1525   MCV 92.0 03/27/2021 1525   MCH 30.2 03/27/2021 1525   MCHC 32.8 03/27/2021 1525   RDW 14.6 03/27/2021 1525   LYMPHSABS 1.3 03/27/2021 1525   MONOABS 0.5 03/27/2021 1525   EOSABS 0.0 03/27/2021 1525   BASOSABS 0.0 03/27/2021 1525    BMET    Component Value Date/Time   NA 135 03/27/2021 1525   K 4.1 03/27/2021 1525   CL 102 03/27/2021 1525   CO2 24 03/27/2021 1525   GLUCOSE 91 03/27/2021 1525   BUN 15 03/27/2021 1525   CREATININE 0.83 03/27/2021 1525   CALCIUM 9.1 03/27/2021 1525   GFRNONAA >60 03/27/2021 1525   GFRAA >60 03/01/2017 2330    BNP No results found for: BNP  ProBNP No results found for: PROBNP  Imaging: No results found.   Assessment & Plan:   OSA (obstructive sleep apnea) - Diagnosed with sleep apnea in 2008, Cpap titration study in 2010 showed that she had 22 events/hour supine position. Optimal pressure was 7cm h20 with small nasal mask - She is having difficulty with her CPAP,  reports waking up chocking/struggling to breath. We do not have download for her current machine, she does not have an SD card. Her current CPAP pressure is either 7 or 8cm h20. Recommend changing to auto titrate 5-15cm h20 if possible, if not she will either need CPAP titration study or new CPAP machine as it is >52-51 years old. DME company also needs to provide patient with SD card.  - Follow-up in 3 months or sooner     Glenford Bayley, NP 04/28/2021

## 2021-04-28 ENCOUNTER — Encounter: Payer: Self-pay | Admitting: Primary Care

## 2021-04-28 ENCOUNTER — Other Ambulatory Visit: Payer: Self-pay

## 2021-04-28 ENCOUNTER — Ambulatory Visit (INDEPENDENT_AMBULATORY_CARE_PROVIDER_SITE_OTHER): Payer: Medicare Other | Admitting: Primary Care

## 2021-04-28 VITALS — BP 120/70 | HR 60 | Ht 63.0 in | Wt 269.4 lb

## 2021-04-28 DIAGNOSIS — G4733 Obstructive sleep apnea (adult) (pediatric): Secondary | ICD-10-CM | POA: Diagnosis not present

## 2021-04-28 NOTE — Patient Instructions (Signed)
Orders: - Change CPAP setting to auto titrate 5-15cm h20  - Needs SD card for machine - If unable to change setting to auto titrate see if patient would qualify for new machine  Follow-up - 3 months with Dr. Vassie Loll or [facility] NP / Call sooner if you are having any difficulty with CPAP setting or still experiencing choking sensation    CPAP and BPAP Information CPAP and BPAP are methods that use air pressure to keep your airways open and to help you breathe well. CPAP and BPAP use different amounts of pressure. Your health care provider will tell you whether CPAP or BPAP would be more helpful for you.  CPAP stands for "continuous positive airway pressure." With CPAP, the amount of pressure stays the same while you breathe in and out.  BPAP stands for "bi-level positive airway pressure." With BPAP, the amount of pressure will be higher when you breathe in (inhale) and lower when you breathe out(exhale). This allows you to take larger breaths. CPAP or BPAP may be used in the hospital, or your health care provider may want you to use it at home. You may need to have a sleep study before your health care provider can order a machine for you to use at home. Why are CPAP and BPAP treatments used? CPAP or BPAP can be helpful if you have:  Sleep apnea.  Chronic obstructive pulmonary disease (COPD).  Heart failure.  Medical conditions that cause muscle weakness, including muscular dystrophy or amyotrophic lateral sclerosis (ALS).  Other problems that cause breathing to be shallow, weak, abnormal, or difficult. CPAP and BPAP are most commonly used for obstructive sleep apnea (OSA) to keep the airways from collapsing when the muscles relax during sleep. How is CPAP or BPAP administered? Both CPAP and BPAP are provided by a small machine with a flexible plastic tube that attaches to a plastic mask that you wear. Air is blown through the mask into your nose or mouth. The amount of pressure that is used  to blow the air can be adjusted on the machine. Your health care provider will set the pressure setting and help you find the best mask for you. When should CPAP or BPAP be used? In most cases, the mask only needs to be worn during sleep. Generally, the mask needs to be worn throughout the night and during any daytime naps. People with certain medical conditions may also need to wear the mask at other times when they are awake. Follow instructions from your health care provider about when to use the machine. What are some tips for using the mask?  Because the mask needs to be snug, some people feel trapped or closed-in (claustrophobic) when first using the mask. If you feel this way, you may need to get used to the mask. One way to do this is to hold the mask loosely over your nose or mouth and then gradually apply the mask more snugly. You can also gradually increase the amount of time that you use the mask.  Masks are available in various types and sizes. If your mask does not fit well, talk with your health care provider about getting a different one. Some common types of masks include: ? Full face masks, which fit over the mouth and nose. ? Nasal masks, which fit over the nose. ? Nasal pillow or prong masks, which fit into the nostrils.  If you are using a mask that fits over your nose and you tend to breathe  through your mouth, a chin strap may be applied to help keep your mouth closed.  Some CPAP and BPAP machines have alarms that may sound if the mask comes off or develops a leak.  If you have trouble with the mask, it is very important that you talk with your health care provider about finding a way to make the mask easier to tolerate. Do not stop using the mask. There could be a negative impact to your health if you stop using the mask.   What are some tips for using the machine?  Place your CPAP or BPAP machine on a secure table or stand near an electrical outlet.  Know where the  on/off switch is on the machine.  Follow instructions from your health care provider about how to set the pressure on your machine and when you should use it.  Do not eat or drink while the CPAP or BPAP machine is on. Food or fluids could get pushed into your lungs by the pressure of the CPAP or BPAP.  For home use, CPAP and BPAP machines can be rented or purchased through home health care companies. Many different brands of machines are available. Renting a machine before purchasing may help you find out which particular machine works well for you. Your insurance may also decide which machine you may get.  Keep the CPAP or BPAP machine and attachments clean. Ask your health care provider for specific instructions. Follow these instructions at home:  Do not use any products that contain nicotine or tobacco, such as cigarettes, e-cigarettes, and chewing tobacco. If you need help quitting, ask your health care provider.  Keep all follow-up visits as told by your health care provider. This is important. Contact a health care provider if:  You have redness or pressure sores on your head, face, mouth, or nose from the mask or head gear.  You have trouble using the CPAP or BPAP machine.  You cannot tolerate wearing the CPAP or BPAP mask.  Someone tells you that you snore even when wearing your CPAP or BPAP. Get help right away if:  You have trouble breathing.  You feel confused. Summary  CPAP and BPAP are methods that use air pressure to keep your airways open and to help you breathe well.  You may need to have a sleep study before your health care provider can order a machine for home use.  If you have trouble with the mask, it is very important that you talk with your health care provider about finding a way to make the mask easier to tolerate. Do not stop using the mask. There could be a negative impact to your health if you stop using the mask.  Follow instructions from your health  care provider about when to use the machine. This information is not intended to replace advice given to you by your health care provider. Make sure you discuss any questions you have with your health care provider. Document Revised: 12/04/2019 Document Reviewed: 12/07/2019 Elsevier Patient Education  2021 [employer].

## 2021-04-28 NOTE — Assessment & Plan Note (Addendum)
-   Diagnosed with sleep apnea in 2008, Cpap titration study in 2010 showed that she had 22 events/hour supine position. Optimal pressure was 7cm h20 with small nasal mask - She is having difficulty with her CPAP, reports waking up chocking/struggling to breath. We do not have download for her current machine, she does not have an SD card. Her current CPAP pressure is either 7 or 8cm h20. Recommend changing to auto titrate 5-15cm h20 if possible, if not she will either need CPAP titration study or new CPAP machine as it is >34-61 years old. DME company also needs to provide patient with SD card.  - Follow-up in 3 months or sooner, can consider discussing alternative to CPAP at next visit if still struggling to tolerate

## 2021-06-18 ENCOUNTER — Other Ambulatory Visit: Payer: Self-pay

## 2021-06-18 ENCOUNTER — Emergency Department
Admission: EM | Admit: 2021-06-18 | Discharge: 2021-06-18 | Disposition: A | Discharge status: Home / Self Care | Payer: Medicare Other | Attending: Emergency Medicine | Admitting: Emergency Medicine

## 2021-06-18 DIAGNOSIS — R42 Dizziness and giddiness: Secondary | ICD-10-CM | POA: Insufficient documentation

## 2021-06-18 DIAGNOSIS — R519 Headache, unspecified: Secondary | ICD-10-CM | POA: Insufficient documentation

## 2021-06-18 DIAGNOSIS — M542 Cervicalgia: Secondary | ICD-10-CM | POA: Insufficient documentation

## 2021-06-18 DIAGNOSIS — Z5321 Procedure and treatment not carried out due to patient leaving prior to being seen by health care provider: Secondary | ICD-10-CM | POA: Insufficient documentation

## 2021-06-18 MED ORDER — PROCHLORPERAZINE MALEATE 10 MG PO TABS
10.0000 mg | ORAL_TABLET | Freq: Once | ORAL | Status: AC
  Administered 2021-06-18: 10 mg via ORAL
  Filled 2021-06-18: qty 1

## 2021-06-18 NOTE — ED Provider Notes (Signed)
Emergency Medicine Provider Triage Evaluation Note  Eeva Schlosser , a 70 y.o. female  was evaluated in triage.  Pt complains of headache, history of migraine.  Review of Systems  Positive: Headache Negative: Vomiting  Physical Exam  BP (!) 166/72   Pulse 65   Temp 98.3 F (36.8 C) (Oral)   Resp 16   Ht 5\' 3"  (1.6 m)   Wt 119.7 kg   SpO2 100%   BMI 46.77 kg/m  Gen:   Awake, mild distress   Resp:  Normal effort  MSK:   Moves extremities without difficulty  Other:    Medical Decision Making  Medically screening exam initiated at 12:44 AM.  Appropriate orders placed.  Lafern Brinkley was informed that the remainder of the evaluation will be completed by another provider, this initial triage assessment does not replace that evaluation, and the importance of remaining in the ED until their evaluation is complete.  70 year old female presenting with headache; history of migraines.  Supposed to take nortriptyline at bedtime but does not because she does not like to take medications.  Awaiting treatment room.   66, MD 06/18/21 [phone]

## 2021-06-18 NOTE — ED Triage Notes (Signed)
Pt states is lightheaded, has a headache and neck pain. Pt states began around 1900 yesterday. Pt ambulatorywithout difficulty.;

## 2021-06-18 NOTE — ED Notes (Signed)
Pt states she is leaving  

## 2021-08-04 ENCOUNTER — Ambulatory Visit: Payer: Medicare Other | Admitting: Primary Care

## 2021-08-11 ENCOUNTER — Ambulatory Visit (INDEPENDENT_AMBULATORY_CARE_PROVIDER_SITE_OTHER): Payer: Medicare Other | Admitting: Primary Care

## 2021-08-11 ENCOUNTER — Other Ambulatory Visit: Payer: Self-pay

## 2021-08-11 DIAGNOSIS — G4733 Obstructive sleep apnea (adult) (pediatric): Secondary | ICD-10-CM | POA: Diagnosis not present

## 2021-08-11 NOTE — Patient Instructions (Signed)
No changed Good compliance, minimal residual apneas Follow-up in 6 months with Dr. Vassie Loll

## 2021-08-11 NOTE — Progress Notes (Signed)
Virtual Visit via Telephone Note  I connected with Cindy Anthony on 08/11/21 at  9:00 AM EDT by telephone and verified that I am speaking with the correct person using two identifiers.  Location: Patient: Home Provider: Office   I discussed the limitations, risks, security and privacy concerns of performing an evaluation and management service by telephone and the availability of in person appointments. I also discussed with the patient that there may be a patient responsible charge related to this service. The patient expressed understanding and agreed to proceed.   History of Present Illness:  70 year old female, never smoked. PMH significant for hypertension, obstructive sleep apnea, GERD.  Patient of Dr. Vassie Loll. Diagnosed with sleep apnea in 2008, Cpap titration study in 2010 she had 22 events/hour supine position. Maintained on CPAP at 7 cm H2O with small nasal mask.  History of gastric bypass in 2010, peak weight 288 lbs. DME company is [employer].   Previous [facility] pulmonary encounter: 10/04/2020 Patient presents today for a 1 year follow-up OSA on CPAP. She is compliant with CPAP use, having some trouble recently. States that on four separate occasions she has woken up feeling short of breath/gasping for air. She is more tired. She wears nasal pillow mask. She is cleaning and changing out mask once a month. She does sleep with her mouth open.   Airview download 07/25/18-08/23/18 30/30 days used (100%); 28 days (93%) > 4 hours Average usage days used 7 hours 38 mins Pressure 7cm h20 Airleaks 20.2L/min AHI 1.6   11/07/2020  Patient contacted today for 1 month follow-up. During last visit she reports waking up on four separate occasional feeling short of breath/gasping for air. We increased her pressure to 8cm h20 and added a chin strap as she is a mouth breather. She is sleeping better. She has had one occurrence of waking up coughing at night. She brought her machine in to [facility] and they  increased CPAP pressure to 8cm. She did not get a chin strap but feels she would benefit from one. She recently changed out her nasal mask for a new one. 2  04/28/2021- Interim hx  Patient presents today for 6 month follow-up OSA. During last visit we change her CPAP pressure to 8cm h20 and placed an order for chin strap. She is still experiencing periods of choking and coughing at night, she wakes up on occasional feeling like she can not breath. She wears nasal pillow mask. Unable to assess download. She gets approx 8-9 hours of sleep a night. She goes to sleep around 9pm and gets out of bed at 5am. She wakes up 2 times at night and is able to go right back to sleep. She sleeps with TV. Denies overt reflux symptoms.   08/11/2021- Interim hx  Patient contacted today for 3 month follow-up OSA. She is doing well. No issues with CPAP machine or mask. She never received chin strap and pressure setting was not changed to auto. Current CPAP pressure is 7cm h20. She is no longer having periods of choking or coughing at night. She does report occasional dry mouth and intermittent migraine headaches. CPAP. She is using humidification with her CPAP. She is following with neurology, takes acetaminophen-butalbital as needed 1-2 times a day.  She was seen at [facility] ED on 07/07/21 for dyspnea on exertion. CXR showed clear lungs. She has no reports of shortness of breath with exertion. She is walking daily.   Airview download 07/25/21-08/23/21 30/30 days used; 93% > 4 hours  Average usage days used 7 hours 38 mins Pressure 7cm h20 Airleaks 20.2L/min (95%) AHI 1.6  Observations/Objective:  Able to speak in full sentences, no overt shortness of breath or wheezing   Assessment and Plan:  OSA: - Patient is 100% compliant with use, no issues with mask or pressure setting - CPAP pressure 7cm h20, residual AHI 1.6 - No changes today - Continue to use CPAP every night for 4-6 hours or longer  Follow Up  Instructions:  6 month follow-up with Dr. Vassie Loll    I discussed the assessment and treatment plan with the patient. The patient was provided an opportunity to ask questions and all were answered. The patient agreed with the plan and demonstrated an understanding of the instructions.   The patient was advised to call back or seek an in-person evaluation if the symptoms worsen or if the condition fails to improve as anticipated.  I provided 30 minutes of non-face-to-face time during this encounter.   Glenford Bayley, NP

## 2021-09-30 ENCOUNTER — Other Ambulatory Visit: Payer: Self-pay

## 2021-09-30 ENCOUNTER — Encounter: Payer: Self-pay | Admitting: Emergency Medicine

## 2021-09-30 ENCOUNTER — Ambulatory Visit
Admission: EM | Admit: 2021-09-30 | Discharge: 2021-09-30 | Disposition: A | Discharge status: Home / Self Care | Payer: Medicare Other | Attending: Emergency Medicine | Admitting: Emergency Medicine

## 2021-09-30 DIAGNOSIS — R319 Hematuria, unspecified: Secondary | ICD-10-CM | POA: Insufficient documentation

## 2021-09-30 DIAGNOSIS — N390 Urinary tract infection, site not specified: Secondary | ICD-10-CM

## 2021-09-30 LAB — POCT URINALYSIS DIP (MANUAL ENTRY)
Bilirubin, UA: NEGATIVE
Glucose, UA: NEGATIVE mg/dL
Nitrite, UA: NEGATIVE
Protein Ur, POC: 100 mg/dL — AB
Spec Grav, UA: 1.025 (ref 1.010–1.025)
Urobilinogen, UA: 0.2 E.U./dL
pH, UA: 5.5 (ref 5.0–8.0)

## 2021-09-30 MED ORDER — CEPHALEXIN 500 MG PO CAPS: 500.0000 mg | ORAL_CAPSULE | Freq: Two times a day (BID) | ORAL | 0 refills | Status: AC

## 2021-09-30 NOTE — Discharge Instructions (Addendum)
Take the antibiotic as directed.  The urine culture is pending.  We will call you if it shows the need to change or discontinue your antibiotic.    Follow up with your primary care provider if your symptoms are not improving.    

## 2021-09-30 NOTE — ED Triage Notes (Signed)
Pt here with pressure, urgency and increased frequency x 2 days.

## 2021-09-30 NOTE — ED Provider Notes (Signed)
Roderic Palau    CSN: [id number] Arrival date & time: 09/30/21  0934      History   Chief Complaint Chief Complaint  Patient presents with   Dysuria     HPI Cindy Anthony is a 70 y.o. female.  Patient presents with 2-day history of urinary frequency, urgency, bladder "pressure."  She states this is similar to previous UTI.  She denies fever, chills, hematuria, flank pain, abdominal pain, vaginal discharge, pelvic pain, or other symptoms.  No treatments at home.  Her medical history includes hypertension, morbid obesity, sleep apnea, vertigo, arthritis, depression, anxiety.  The history is provided by the patient and medical records.   Past Medical History:  Diagnosis Date   Anxiety    Arthritis    OSTEOARTHRITIS   Benign paroxysmal positional vertigo    Depression    GERD (gastroesophageal reflux disease)    Hemorrhoids    History of chicken pox    Hyperlipidemia    Hypertension    Piriformis syndrome of both sides    Sleep apnea     Patient Active Problem List   Diagnosis Date Noted   Chronic mesenteric ischemia ([place]) 07/25/2015   Bacteriuria with pyuria 07/03/2015   Left flank pain, chronic 06/30/2015   Essential hypertension 05/12/2015   Morbid obesity ([place]) 03/03/2015   Hip pain 03/03/2015   GERD (gastroesophageal reflux disease) 03/03/2015   Moderate obstructive sleep apnea 03/03/2015    Past Surgical History:  Procedure Laterality Date   ABDOMINAL HYSTERECTOMY     BARIATRIC SURGERY  2013   sleeve   COLONOSCOPY WITH PROPOFOL N/A 11/28/2018   Procedure: COLONOSCOPY WITH PROPOFOL;  Surgeon: Manya Silvas, MD;  Location: [facility] ENDOSCOPY;  Service: Endoscopy;  Laterality: N/A;   ESOPHAGOGASTRODUODENOSCOPY (EGD) WITH PROPOFOL N/A 11/28/2018   Procedure: ESOPHAGOGASTRODUODENOSCOPY (EGD) WITH PROPOFOL;  Surgeon: Manya Silvas, MD;  Location: [facility] ENDOSCOPY;  Service: Endoscopy;  Laterality: N/A;   JOINT REPLACEMENT     SHOULDER ARTHROSCOPY Right  2007   TOTAL KNEE ARTHROPLASTY Right 2009    OB History   No obstetric history on file.      Home Medications    Prior to Admission medications   Medication Sig Start Date End Date Taking? Authorizing Provider  cephALEXin (KEFLEX) 500 MG capsule Take 1 capsule (500 mg total) by mouth 2 (two) times daily for 5 days. 09/30/21 10/05/21 Yes Sharion Balloon, NP  ACETAMINOPHEN-BUTALBITAL 50-325 MG TABS Take 1 tablet by mouth every 8 (eight) hours as needed. As needed for migraine    [provider]  famotidine (PEPCID) 40 MG tablet Take 40 mg by mouth daily.    [provider]  irbesartan (AVAPRO) 300 MG tablet Take 300 mg by mouth at bedtime. 02/03/21   [provider]  Lidocaine 2 % GEL Apply 1 application topically 3 (three) times daily as needed. 07/23/18   Cuthriell, Charline Bills, PA-C  LORazepam (ATIVAN) 0.5 MG tablet Take 0.5 mg by mouth every 8 (eight) hours as needed for anxiety. Patient not taking: No sig reported    [provider]  metFORMIN (GLUCOPHAGE) 500 MG tablet Take by mouth. Patient not taking: Reported on 08/11/2021    [provider]  nortriptyline (PAMELOR) 10 MG capsule Take 10 mg by mouth at bedtime. Patient not taking: No sig reported    [provider]  spironolactone (ALDACTONE) 25 MG tablet Take 1 tablet by mouth daily. 02/03/21 02/03/22  [provider]    Family History  Family History  Problem Relation Age of Onset   Arthritis Mother    Hyperlipidemia Mother    Heart disease Mother    Stroke Mother    Hypertension Mother    Kidney disease Mother    Diabetes Mother    Colon polyps Mother    Heart attack Mother    Arthritis Father    Heart disease Father    Diabetes Paternal Grandmother    Hyperlipidemia Sister    Hypertension Brother     Social History Social History   Tobacco Use   Smoking status: Never   Smokeless tobacco: Never  Vaping Use   Vaping Use: Never used  Substance Use  Topics   Alcohol use: No    Alcohol/week: 0.0 standard drinks   Drug use: No     Allergies   Amlodipine, Lisinopril, Other, and Shellfish-derived products   Review of Systems Review of Systems  Constitutional:  Negative for chills and fever.  Respiratory:  Negative for cough and shortness of breath.   Cardiovascular:  Negative for chest pain and palpitations.  Gastrointestinal:  Negative for abdominal pain and vomiting.  Genitourinary:  Positive for dysuria, frequency and urgency. Negative for flank pain, hematuria, pelvic pain and vaginal discharge.  Musculoskeletal:  Negative for arthralgias and back pain.  Skin:  Negative for color change and rash.  All other systems reviewed and are negative.   Physical Exam Triage Vital Signs ED Triage Vitals  Enc Vitals Group     BP      Pulse      Resp      Temp      Temp src      SpO2      Weight      Height      Head Circumference      Peak Flow      Pain Score      Pain Loc      Pain Edu?      Excl. in GC?    No data found.  Updated Vital Signs BP 132/71   Pulse 80   Temp 98.8 F (37.1 C)   Resp 20   SpO2 98%   Visual Acuity Right Eye Distance:   Left Eye Distance:   Bilateral Distance:    Right Eye Near:   Left Eye Near:    Bilateral Near:     Physical Exam Vitals and nursing note reviewed.  Constitutional:      General: She is not in acute distress.    Appearance: She is well-developed. She is obese. She is not ill-appearing.  HENT:     Head: Normocephalic and atraumatic.     Mouth/Throat:     Mouth: Mucous membranes are moist.  Eyes:     Conjunctiva/sclera: Conjunctivae normal.  Cardiovascular:     Rate and Rhythm: Normal rate and regular rhythm.     Heart sounds: Normal heart sounds.  Pulmonary:     Effort: Pulmonary effort is normal. No respiratory distress.     Breath sounds: Normal breath sounds.  Abdominal:     Palpations: Abdomen is soft.     Tenderness: There is no abdominal  tenderness. There is no right CVA tenderness, left CVA tenderness, guarding or rebound.  Musculoskeletal:     Cervical back: Neck supple.  Skin:    General: Skin is warm and dry.  Neurological:     Mental Status: She is alert.  Psychiatric:        Behavior:  Behavior normal.     UC Treatments / Results  Labs (all labs ordered are listed, but only abnormal results are displayed) Labs Reviewed  POCT URINALYSIS DIP (MANUAL ENTRY) - Abnormal; Notable for the following components:      Result Value   Ketones, POC UA trace (5) (*)    Blood, UA large (*)    Protein Ur, POC =100 (*)    Leukocytes, UA Small (1+) (*)    All other components within normal limits  URINE CULTURE    EKG   Radiology No results found.  Procedures Procedures (including critical care time)  Medications Ordered in UC Medications - No data to display  Initial Impression / Assessment and Plan / UC Course  I have reviewed the triage vital signs and the nursing notes.  Pertinent labs & imaging results that were available during my care of the patient were reviewed by me and considered in my medical decision making (see chart for details).   UTI.  Treating with Keflex. Urine culture pending. Discussed with patient that we will call her if the urine culture shows the need to change or discontinue the antibiotic. Instructed her to follow-up with her PCP if her symptoms are not improving. Patient agrees to plan of care.      Final Clinical Impressions(s) / UC Diagnoses   Final diagnoses:  Urinary tract infection with hematuria, site unspecified     Discharge Instructions      Take the antibiotic as directed.  The urine culture is pending.  We will call you if it shows the need to change or discontinue your antibiotic.    Follow up with your primary care provider if your symptoms are not improving.        ED Prescriptions     Medication Sig Dispense Auth. Provider   cephALEXin (KEFLEX) 500 MG  capsule Take 1 capsule (500 mg total) by mouth 2 (two) times daily for 5 days. 10 capsule Sharion Balloon, NP      PDMP not reviewed this encounter.   Sharion Balloon, NP 09/30/21 1019

## 2021-10-03 LAB — URINE CULTURE: Culture: <10000 — AB

## 2021-11-28 NOTE — Telephone Encounter (Signed)
Beth, please advise on pt's email regarding her CPAP. Pt last seen in 07/2021. Thanks!

## 2021-12-19 NOTE — Telephone Encounter (Signed)
Sorry just getting to this. We can refer her to orthodontist for consideration for oral appliance or ENT for possible surgical procedure or Inspire device. She will likely need to repeat sleep study for either of them however.Happy to order if she would like to consider either of those options

## 2021-12-29 ENCOUNTER — Ambulatory Visit (INDEPENDENT_AMBULATORY_CARE_PROVIDER_SITE_OTHER): Payer: Medicare Other | Admitting: Primary Care

## 2021-12-29 ENCOUNTER — Other Ambulatory Visit: Payer: Self-pay

## 2021-12-29 ENCOUNTER — Encounter: Payer: Self-pay | Admitting: Primary Care

## 2021-12-29 VITALS — BP 128/82 | HR 64 | Temp 98.1°F | Ht 63.0 in | Wt 268.0 lb

## 2021-12-29 DIAGNOSIS — G4733 Obstructive sleep apnea (adult) (pediatric): Secondary | ICD-10-CM | POA: Diagnosis not present

## 2021-12-29 NOTE — Assessment & Plan Note (Addendum)
-   Hx moderate OSA, intolerant to CPAP. Discussed risk of untreated sleep apnea including cardiac arrhthymias, stroke, pulm HTN and DM. She is not open to surgical options. She is working on weight loss. Referring to orthodontics for consideration for oral appliance (ordered updated home sleep study). Encourage side sleeping position and advised against driving if experiencing excessive daytime sleepiness. FU in 6 months or sooner if needed.

## 2021-12-29 NOTE — Progress Notes (Signed)
_0  ID: Cindy Anthony, female    DOB: Dec 11, 1950, 71 y.o.   MRN: [id number]  Chief Complaint  Patient presents with   Follow-up    Stopped using CPAP r/t "smothering"     Referring provider: Barbaraann Boys, MD  HPI: 71 year old female, never smoked. PMH significant for hypertension, obstructive sleep apnea, GERD.  Patient of Dr. Elsworth Soho. Diagnosed with sleep apnea in 2008, Cpap titration study in 2010 she had 22 events/hour supine position. Maintained on CPAP at 7 cm H2O with small nasal mask.  History of gastric bypass in 2010, peak weight 288 lbs. DME company is [employer].   Previous [facility] pulmonary encounter: 10/04/2020 Patient presents today for a 1 year follow-up OSA on CPAP. She is compliant with CPAP use, having some trouble recently. States that on four separate occasions she has woken up feeling short of breath/gasping for air. She is more tired. She wears nasal pillow mask. She is cleaning and changing out mask once a month. She does sleep with her mouth open.   Airview download 07/25/18-08/23/18 30/30 days used (100%); 28 days (93%) > 4 hours Average usage days used 7 hours 38 mins Pressure 7cm h20 Airleaks 20.2L/min AHI 1.6   11/07/2020  Patient contacted today for 1 month follow-up. During last visit she reports waking up on four separate occasional feeling short of breath/gasping for air. We increased her pressure to 8cm h20 and added a chin strap as she is a mouth breather. She is sleeping better. She has had one occurrence of waking up coughing at night. She brought her machine in to [facility] and they increased CPAP pressure to 8cm. She did not get a chin strap but feels she would benefit from one. She recently changed out her nasal mask for a new one. 2  04/28/2021 Patient presents today for 6 month follow-up OSA. During last visit we change her CPAP pressure to 8cm h20 and placed an order for chin strap. She is still experiencing periods of choking and coughing at night, she  wakes up on occasional feeling like she can not breath. She wears nasal pillow mask. Unable to assess download. She gets approx 8-9 hours of sleep a night. She goes to sleep around 9pm and gets out of bed at 5am. She wakes up 2 times at night and is able to go right back to sleep. She sleeps with TV. Denies overt reflux symptoms.   08/11/2021 Patient contacted today for 3 month follow-up OSA. She is doing well. No issues with CPAP machine or mask. She never received chin strap and pressure setting was not changed to auto. Current CPAP pressure is 7cm h20. She is no longer having periods of choking or coughing at night. She does report occasional dry mouth and intermittent migraine headaches. CPAP. She is using humidification with her CPAP. She is following with neurology, takes acetaminophen-butalbital as needed 1-2 times a day.  She was seen at [facility] ED on 07/07/21 for dyspnea on exertion. CXR showed clear lungs. She has no reports of shortness of breath with exertion. She is walking daily.   Airview download 07/25/21-08/23/21 30/30 days used; 93% > 4 hours Average usage days used 7 hours 38 mins Pressure 7cm h20 Airleaks 20.2L/min (95%) AHI 1.6  12/29/2021- interim hx  Patient presents today for acute OV/sleep apnea. Patient is having issues with CPAP, feels it is smothering her. She is not currently using CPAP, has not used in 8 months. We had previously tried adjusting her pressure.  She does not want to try CPAP again. She feels she is sleeping better without using CPAP.  She wakes up on average once a night. She does wake up tired. We discussed risk of untreated sleep apnea including cardiac arrhthymias, stroke, pulm HTN and DM. She is interested in other treatment options for her OSA. She is not open to surgical options. She is working on weight loss.     Allergies  Allergen Reactions   Amlodipine Diarrhea   Lisinopril Other (See Comments)    Dizziness   Other Other (See Comments)    Shellfish-Derived Products Other (See Comments)    Immunization History  Administered Date(s) Administered   Fluad Quad(high Dose 65+) 08/26/2020, 09/06/2021   Influenza, High Dose Seasonal PF 08/29/2017   Influenza,inj,Quad PF,6+ Mos 11/26/2014, 08/26/2015   Influenza-Unspecified 08/24/2016, 08/29/2017, 08/30/2018   MMR 02/04/1997   PFIZER Comirnaty(Gray Top)Covid-19 Tri-Sucrose Vaccine 01/16/2020, 02/07/2020, 09/16/2020, 04/18/2021, 08/15/2021   PFIZER(Purple Top)SARS-COV-2 Vaccination 08/15/2021   Pneumococcal Conjugate-13 03/20/2018   Pneumococcal Polysaccharide-23 05/20/2019, 08/15/2021   Tdap 11/26/2010   Varicella 10/17/2011   Zoster Recombinat (Shingrix) 11/30/2020, 02/24/2021   Zoster, Live 04/26/2014    Past Medical History:  Diagnosis Date   Anxiety    Arthritis    OSTEOARTHRITIS   Benign paroxysmal positional vertigo    Depression    GERD (gastroesophageal reflux disease)    Hemorrhoids    History of chicken pox    Hyperlipidemia    Hypertension    Piriformis syndrome of both sides    Sleep apnea     Tobacco History: Social History   Tobacco Use  Smoking Status Never  Smokeless Tobacco Never   Counseling given: Not Answered   Outpatient Medications Prior to Visit  Medication Sig Dispense Refill   famotidine (PEPCID) 40 MG tablet Take 40 mg by mouth daily.     irbesartan (AVAPRO) 300 MG tablet Take 300 mg by mouth at bedtime.     Lidocaine 2 % GEL Apply 1 application topically 3 (three) times daily as needed. 30 g 1   metFORMIN (GLUCOPHAGE) 500 MG tablet Take by mouth.     nortriptyline (PAMELOR) 10 MG capsule Take 10 mg by mouth at bedtime.     spironolactone (ALDACTONE) 25 MG tablet Take 1 tablet by mouth daily.     ACETAMINOPHEN-BUTALBITAL 50-325 MG TABS Take 1 tablet by mouth every 8 (eight) hours as needed. As needed for migraine (Patient not taking: Reported on 12/29/2021)     LORazepam (ATIVAN) 0.5 MG tablet Take 0.5 mg by mouth every 8 (eight)  hours as needed for anxiety. (Patient not taking: Reported on 12/29/2021)     No facility-administered medications prior to visit.    Review of Systems  Review of Systems  Constitutional: Negative.   HENT: Negative.    Respiratory: Negative.    Psychiatric/Behavioral:  Negative for sleep disturbance.     Physical Exam  BP 128/82 (BP Location: Left Arm, Patient Position: Sitting, Cuff Size: Large)    Pulse 64    Temp 98.1 F (36.7 C) (Oral)    Ht 5' 3" (1.6 m)    Wt 268 lb (121.6 kg)    SpO2 99%    BMI 47.47 kg/m  Physical Exam Constitutional:      General: She is not in acute distress.    Appearance: Normal appearance. She is obese. She is not ill-appearing.  HENT:     Head: Normocephalic and atraumatic.  Cardiovascular:     Rate and  Rhythm: Normal rate and regular rhythm.  Pulmonary:     Effort: Pulmonary effort is normal.     Breath sounds: Normal breath sounds.  Musculoskeletal:        General: Normal range of motion.  Skin:    General: Skin is warm and dry.  Neurological:     General: No focal deficit present.     Mental Status: She is alert and oriented to person, place, and time. Mental status is at baseline.  Psychiatric:        Mood and Affect: Mood normal.        Behavior: Behavior normal.        Thought Content: Thought content normal.        Judgment: Judgment normal.     Lab Results:  CBC    Component Value Date/Time   WBC 9.7 03/27/2021 1525   RBC 4.77 03/27/2021 1525   HGB 14.4 03/27/2021 1525   HCT 43.9 03/27/2021 1525   PLT 299 03/27/2021 1525   MCV 92.0 03/27/2021 1525   MCH 30.2 03/27/2021 1525   MCHC 32.8 03/27/2021 1525   RDW 14.6 03/27/2021 1525   LYMPHSABS 1.3 03/27/2021 1525   MONOABS 0.5 03/27/2021 1525   EOSABS 0.0 03/27/2021 1525   BASOSABS 0.0 03/27/2021 1525    BMET    Component Value Date/Time   NA 135 03/27/2021 1525   K 4.1 03/27/2021 1525   CL 102 03/27/2021 1525   CO2 24 03/27/2021 1525   GLUCOSE 91 03/27/2021 1525    BUN 15 03/27/2021 1525   CREATININE 0.83 03/27/2021 1525   CALCIUM 9.1 03/27/2021 1525   GFRNONAA >60 03/27/2021 1525   GFRAA >60 03/01/2017 2330    BNP No results found for: BNP  ProBNP No results found for: PROBNP  Imaging: No results found.   Assessment & Plan:   Moderate obstructive sleep apnea - Hx moderate OSA, intolerant to CPAP. Discussed risk of untreated sleep apnea including cardiac arrhthymias, stroke, pulm HTN and DM. She is not open to surgical options. She is working on weight loss. Referring to orthodontics for consideration for oral appliance (ordered updated home sleep study). Encourage side sleeping position and advised against driving if experiencing excessive daytime sleepiness. FU in 6 months or sooner if needed.      Martyn Ehrich, NP 12/29/2021

## 2021-12-29 NOTE — Patient Instructions (Signed)
Recommendations: Encourage weight loss efforts AND side sleeping position Do not drive if excessively tired   Orders: Home sleep study re: OSA Referral orthodontics consideration for oral appliance (Dr. Myrtis Ser [phone])  Follow-up: 6 months with Dr. Vassie Loll  Sleep Apnea Sleep apnea affects breathing during sleep. It causes breathing to stop for 10 seconds or more, or to become shallow. People with sleep apnea usually snore loudly. It can also increase the risk of: Heart attack. Stroke. Being very overweight (obese). Diabetes. Heart failure. Irregular heartbeat. High blood pressure. The goal of treatment is to help you breathe normally again. What are the causes? The most common cause of this condition is a collapsed or blocked airway. There are three kinds of sleep apnea: Obstructive sleep apnea. This is caused by a blocked or collapsed airway. Central sleep apnea. This happens when the brain does not send the right signals to the muscles that control breathing. Mixed sleep apnea. This is a combination of obstructive and central sleep apnea. What increases the risk? Being overweight. Smoking. Having a small airway. Being older. Being female. Drinking alcohol. Taking medicines to calm yourself (sedatives or tranquilizers). Having family members with the condition. Having a tongue or tonsils that are larger than normal. What are the signs or symptoms? Trouble staying asleep. Loud snoring. Headaches in the morning. Waking up gasping. Dry mouth or sore throat in the morning. Being sleepy or tired during the day. If you are sleepy or tired during the day, you may also: Not be able to focus your mind (concentrate). Forget things. Get angry a lot and have mood swings. Feel sad (depressed). Have changes in your personality. Have less interest in sex, if you are female. Be unable to have an erection, if you are female. How is this treated?  Sleeping on your side. Using a  medicine to get rid of mucus in your nose (decongestant). Avoiding the use of alcohol, medicines to help you relax, or certain pain medicines (narcotics). Losing weight, if needed. Changing your diet. Quitting smoking. Using a machine to open your airway while you sleep, such as: An oral appliance. This is a mouthpiece that shifts your lower jaw forward. A CPAP device. This device blows air through a mask when you breathe out (exhale). An EPAP device. This has valves that you put in each nostril. A BIPAP device. This device blows air through a mask when you breathe in (inhale) and breathe out. Having surgery if other treatments do not work. Follow these instructions at home: Lifestyle Make changes that your doctor recommends. Eat a healthy diet. Lose weight if needed. Avoid alcohol, medicines to help you relax, and some pain medicines. Do not smoke or use any products that contain nicotine or tobacco. If you need help quitting, ask your doctor. General instructions Take over-the-counter and prescription medicines only as told by your doctor. If you were given a machine to use while you sleep, use it only as told by your doctor. If you are having surgery, make sure to tell your doctor you have sleep apnea. You may need to bring your device with you. Keep all follow-up visits. Contact a doctor if: The machine that you were given to use during sleep bothers you or does not seem to be working. You do not get better. You get worse. Get help right away if: Your chest hurts. You have trouble breathing in enough air. You have an uncomfortable feeling in your back, arms, or stomach. You have trouble talking. One side  of your body feels weak. A part of your face is hanging down. These symptoms may be an emergency. Get help right away. Call your local emergency services (911 in the [place] Do not wait to see if the symptoms will go away. Do not drive yourself to the hospital. Summary This  condition affects breathing during sleep. The most common cause is a collapsed or blocked airway. The goal of treatment is to help you breathe normally while you sleep. This information is not intended to replace advice given to you by your health care provider. Make sure you discuss any questions you have with your health care provider. Document Revised: 06/21/2021 Document Reviewed: 10/21/2020 Elsevier Patient Education  2022 [employer].

## 2022-01-29 NOTE — Telephone Encounter (Signed)
I would need to see a copy of these sleep studies, were they done while she was wearing her oral appliance? If so than that reduction in AHI as because of the treatment. If she is having issues with comfort of devic advise she reach out to Dr. Belia Heman. Otherwise we should prob pursue CPAP if she has not been on. If she has and did not tolerate we can refer her to ENT to discuss inspire/possible surgical options.

## 2022-01-30 ENCOUNTER — Telehealth: Payer: Self-pay | Admitting: Primary Care

## 2022-02-01 NOTE — Telephone Encounter (Signed)
Please see other pt email for context. Pt had her sleep study faxed. Will forward to Harleigh to look for sleep study.  ?

## 2022-02-08 NOTE — Telephone Encounter (Signed)
Will forward this message to Irving Burton and Waynetta Sandy to see if sleep study has been received. Pt is following up again to see if we have this.  ?

## 2022-02-09 DIAGNOSIS — G4733 Obstructive sleep apnea (adult) (pediatric): Secondary | ICD-10-CM

## 2022-02-09 NOTE — Telephone Encounter (Signed)
I checked to see if we had a copy of pt's sleep study in BW's papers from Dr. Henrietta Hoover office and I did not see one in there. Tried to call Dr. Henrietta Hoover office but the office was currently closed. Sent pt a mychart message about this to let her know. ?

## 2022-02-09 NOTE — Telephone Encounter (Signed)
Can someone look to see if we received, please let her know it might take a little time to locate. Cindy Anthony been out of the office and will have several things to look through

## 2022-02-12 NOTE — Telephone Encounter (Signed)
Thank you :)

## 2022-02-12 NOTE — Telephone Encounter (Signed)
Yes I ordered a home sleep study back in early February with out office which does not appear it was done. We can go ahead and get an update study hx moderate OSA

## 2022-02-12 NOTE — Telephone Encounter (Signed)
Sleep test results received from Dr. Henrietta Hoover office, given to Ames Dura NP to review. ?

## 2022-02-12 NOTE — Telephone Encounter (Signed)
Cindy Anthony, ?Patient is requesting to get a HST through our office.  Please see patient comment and advise.  Thank you. ?

## 2022-02-12 NOTE — Telephone Encounter (Signed)
I see where it was ordered in February, we are 10-12 weeks out for getting those scheduled.  I called Dr. Kae Heller office and requested that their sleep study results be faxed to our main fax number.  Once we receive it, we will put it in your box for review.  Thank you. ?

## 2022-02-12 NOTE — Telephone Encounter (Signed)
There are 2 mychart messages regarding this same topic.  I called Dr. Henrietta Hoover office and spoke with Fatima Blank, requested a copy of her sleep study to be faxed to [phone].  She read back a fax # that it has been previously faxed to, however, I did not recognize the fax number.  Advised patient we will have Beth review it once we receive it.  A HST through our office has already been ordered as of 12/29/21 and Waynetta Sandy has been advised that we are 10-12 weeks out scheduling these.  Patient was made aware as well.  Will await fax. ?

## 2022-02-12 NOTE — Telephone Encounter (Signed)
Received HST on 01/12/22 from Dr. Myrtis Ser showed no evidence of obstructive sleep apnea. REI (AHI) 3.5. SpO2 low 62%, average was 94%. Needs ONO on RA (please order) to ensure that she does not need oxygen at night. No indication for oral appliance or CPAP. Recommend she continue with conservative treatment including weight management and side sleeping position. Avoid alcohol and sedating medication at bedtime as these can worsen underlying sleep apnea.  ?

## 2022-03-01 NOTE — Telephone Encounter (Signed)
Beth, please advise if you have received pt's ONO ?

## 2022-03-01 NOTE — Telephone Encounter (Signed)
Irving Burton, do you have an ONO on this pt? Thanks.  ?

## 2022-03-02 NOTE — Telephone Encounter (Signed)
ON oh on room air 02/19/2022 ONO on RA showed patient spent less than 1 minute with SPO2 less than 88%.  SpO2 low 80%, basal 94%. She is not qualify for nocturnal oxygen.

## 2022-03-05 NOTE — Telephone Encounter (Signed)
Cindy Anthony, I apologize for the redundancy but can you confirm pt no longer needs to wear a CPAP nor does she need to wear O2. She is questioning if she even has sleep apnea. There is a pt email from 3/17 when you reviewed the sleep study from Dr. Ron Parker. Thank you.  ?

## 2022-03-06 NOTE — Telephone Encounter (Signed)
Current, she no longer needs to wear oxygen or CPAP

## 2022-03-06 NOTE — Telephone Encounter (Signed)
Noted     Closing encounter

## 2022-05-09 ENCOUNTER — Emergency Department
Admission: EM | Admit: 2022-05-09 | Discharge: 2022-05-09 | Disposition: A | Discharge status: Home / Self Care | Payer: Medicare Other | Attending: Emergency Medicine | Admitting: Emergency Medicine

## 2022-05-09 ENCOUNTER — Other Ambulatory Visit: Payer: Self-pay

## 2022-05-09 ENCOUNTER — Emergency Department: Payer: Medicare Other

## 2022-05-09 DIAGNOSIS — I10 Essential (primary) hypertension: Secondary | ICD-10-CM | POA: Insufficient documentation

## 2022-05-09 DIAGNOSIS — W19XXXA Unspecified fall, initial encounter: Secondary | ICD-10-CM | POA: Diagnosis not present

## 2022-05-09 DIAGNOSIS — S8012XA Contusion of left lower leg, initial encounter: Secondary | ICD-10-CM | POA: Insufficient documentation

## 2022-05-09 DIAGNOSIS — S99912A Unspecified injury of left ankle, initial encounter: Secondary | ICD-10-CM | POA: Diagnosis present

## 2022-05-09 DIAGNOSIS — Y92007 Garden or yard of unspecified non-institutional (private) residence as the place of occurrence of the external cause: Secondary | ICD-10-CM | POA: Insufficient documentation

## 2022-05-09 DIAGNOSIS — S93402A Sprain of unspecified ligament of left ankle, initial encounter: Secondary | ICD-10-CM | POA: Insufficient documentation

## 2022-05-09 DIAGNOSIS — M7989 Other specified soft tissue disorders: Secondary | ICD-10-CM | POA: Insufficient documentation

## 2022-05-09 DIAGNOSIS — Z96652 Presence of left artificial knee joint: Secondary | ICD-10-CM | POA: Insufficient documentation

## 2022-05-09 MED ORDER — OXYCODONE HCL 5 MG PO TABS: ORAL_TABLET | ORAL | 0 refills | Status: DC

## 2022-05-09 NOTE — Discharge Instructions (Addendum)
Follow-up with your primary care provider if any continued problems or not improving.  Wear Ace wrap for support to your ankle.  You may loosen this up if it becomes tight.  Ice and elevation to reduce swelling and help with pain.  A pain medication was sent to your pharmacy.  This medication could cause drowsiness and increase your risk for falling.  Do not take this if you plan on walking or driving.  You may take Tylenol if needed for mild to moderate pain.

## 2022-05-09 NOTE — ED Notes (Signed)
79 yof with a c/c of left calf pain from an accidental fall this morning. The pt stated she tripped and fell on the pavement but denies hitting her head. No hx of blood thinners.

## 2022-05-09 NOTE — ED Triage Notes (Signed)
Pt here with a fall after trying to collect things from her garden. Pt uses a walker to ambulate. Pt c/o left calf and leg pain. Pt denies hip pain.

## 2022-05-09 NOTE — ED Provider Notes (Signed)
[facility] Provider Note    Event Date/Time   First MD Initiated Contact with Patient 05/09/22 [phone]     (approximate)   History   Fall   HPI  Cindy Anthony is a 71 y.o. female   presents to the ED with complaint of left lower extremity pain.  Patient states that she took her garbage out and on the way back fell landing on her left lower extremity.  Denies any head injury or loss of consciousness.  Patient is status post left knee replacement, history of arthritis, hypertension, GERD,      Physical Exam   Triage Vital Signs: ED Triage Vitals  Enc Vitals Group     BP 05/09/22 0722 (!) 158/75     Pulse Rate 05/09/22 0721 70     Resp 05/09/22 0721 18     Temp 05/09/22 0721 98.7 F (37.1 C)     Temp Source 05/09/22 0721 Oral     SpO2 05/09/22 0721 98 %     Weight 05/09/22 0721 270 lb (122.5 kg)     Height 05/09/22 0721 5\' 3"  (1.6 m)     Head Circumference --      Peak Flow --      Pain Score 05/09/22 0721 10     Pain Loc --      Pain Edu? --      Excl. in GC? --     Most recent vital signs: Vitals:   05/09/22 0721 05/09/22 0722  BP:  (!) 158/75  Pulse: 70   Resp: 18   Temp: 98.7 F (37.1 C)   SpO2: 98%      General: Awake, no distress.  CV:  Good peripheral perfusion.  Resp:  Normal effort.  Abd:  No distention.  Other:  On examination of left lower extremity there is no gross deformity.  No abrasions or discoloration noted.  There is some soft tissue edema noted at the left malleolus with minimal tenderness to palpation.  No deformity or effusion noted on examination of the left knee.  Patient is able to flex and extend from both the knee and ankle.  Range of motion is slow and guarded but patient is able to ambulate.  Able to move upper extremities and no tenderness to scalp or cervical spine.   ED Results / Procedures / Treatments   Labs (all labs ordered are listed, but only abnormal results are displayed) Labs Reviewed - No  data to display    RADIOLOGY  X-rays of the left femur and left tib-fib were reviewed by myself independent of the radiologist and no acute fracture was noted.  Radiology report officially is negative for acute bony injury.   PROCEDURES:  Critical Care performed:   Procedures   MEDICATIONS ORDERED IN ED: Medications - No data to display   IMPRESSION / MDM / ASSESSMENT AND PLAN / ED COURSE  I reviewed the triage vital signs and the nursing notes.   Differential diagnosis includes, but is not limited to, fracture left hip, fracture left knee, fracture left tib-fib, contusion, sprain secondary to fall.  71 year old female presents to the ED with complaint of left lower extremity pain after she fell this morning.  Patient has a walker that she uses for added support but states that she fell taking some trash out this morning.  She denies any head injury or loss of consciousness.  X-rays were negative for any acute bony injury and patient was made aware.  Patient states that she has some Tylenol at home and also a prescription for oxycodone 5 mg IR was sent to the pharmacy for her to take as needed for severe pain.  She is to ice and elevate as needed for pain and continue using her walker for support and protection.  She will follow-up with her PCP if any continued problems or concerns.      Patient's presentation is most consistent with acute complicated illness / injury requiring diagnostic workup.  FINAL CLINICAL IMPRESSION(S) / ED DIAGNOSES   Final diagnoses:  Sprain of left ankle, unspecified ligament, initial encounter  Contusion of left lower extremity, initial encounter  Fall, initial encounter     Rx / DC Orders   ED Discharge Orders          Ordered    oxyCODONE (OXY IR/ROXICODONE) 5 MG immediate release tablet        05/09/22 8937             Note:  This document was prepared using Dragon voice recognition software and may include unintentional  dictation errors.   Tommi Rumps, PA-C 05/09/22 1323    Chesley Noon, MD 05/10/22 [phone]

## 2022-07-21 ENCOUNTER — Other Ambulatory Visit: Payer: Self-pay

## 2022-07-21 ENCOUNTER — Encounter: Payer: Self-pay | Admitting: Emergency Medicine

## 2022-07-21 DIAGNOSIS — I10 Essential (primary) hypertension: Secondary | ICD-10-CM | POA: Diagnosis not present

## 2022-07-21 DIAGNOSIS — R519 Headache, unspecified: Secondary | ICD-10-CM | POA: Diagnosis present

## 2022-07-21 LAB — CBC WITH DIFFERENTIAL/PLATELET
Abs Immature Granulocytes: 0.03 10*3/uL (ref 0.00–0.07)
Basophils Absolute: 0 10*3/uL (ref 0.0–0.1)
Basophils Relative: 0 %
Eosinophils Absolute: 0.1 10*3/uL (ref 0.0–0.5)
Eosinophils Relative: 1 %
HCT: 43.3 % (ref 36.0–46.0)
Hemoglobin: 13.8 g/dL (ref 12.0–15.0)
Immature Granulocytes: 0 %
Lymphocytes Relative: 22 %
Lymphs Abs: 2.1 10*3/uL (ref 0.7–4.0)
MCH: 29.7 pg (ref 26.0–34.0)
MCHC: 31.9 g/dL (ref 30.0–36.0)
MCV: 93.3 fL (ref 80.0–100.0)
Monocytes Absolute: 0.9 10*3/uL (ref 0.1–1.0)
Monocytes Relative: 9 %
Neutro Abs: 6.3 10*3/uL (ref 1.7–7.7)
Neutrophils Relative %: 68 %
Platelets: 244 10*3/uL (ref 150–400)
RBC: 4.64 MIL/uL (ref 3.87–5.11)
RDW: 13.9 % (ref 11.5–15.5)
WBC: 9.5 10*3/uL (ref 4.0–10.5)
nRBC: 0 % (ref 0.0–0.2)

## 2022-07-21 LAB — URINALYSIS, ROUTINE W REFLEX MICROSCOPIC
Bilirubin Urine: NEGATIVE
Glucose, UA: NEGATIVE mg/dL
Hgb urine dipstick: NEGATIVE
Ketones, ur: NEGATIVE mg/dL
Leukocytes,Ua: NEGATIVE
Nitrite: NEGATIVE
Protein, ur: NEGATIVE mg/dL
Specific Gravity, Urine: 1.026 (ref 1.005–1.030)
pH: 5 (ref 5.0–8.0)

## 2022-07-21 LAB — COMPREHENSIVE METABOLIC PANEL
ALT: 17 U/L (ref 0–44)
AST: 19 U/L (ref 15–41)
Albumin: 3.8 g/dL (ref 3.5–5.0)
Alkaline Phosphatase: 104 U/L (ref 38–126)
Anion gap: 8 (ref 5–15)
BUN: 14 mg/dL (ref 8–23)
CO2: 24 mmol/L (ref 22–32)
Calcium: 9.1 mg/dL (ref 8.9–10.3)
Chloride: 109 mmol/L (ref 98–111)
Creatinine, Ser: 0.8 mg/dL (ref 0.44–1.00)
GFR, Estimated: >60 mL/min (ref >60)
Glucose, Bld: 125 mg/dL — ABNORMAL HIGH (ref 70–99)
Potassium: 4.3 mmol/L (ref 3.5–5.1)
Sodium: 141 mmol/L (ref 135–145)
Total Bilirubin: 0.9 mg/dL (ref 0.3–1.2)
Total Protein: 7 g/dL (ref 6.5–8.1)

## 2022-07-21 LAB — LIPASE, BLOOD: Lipase: 31 U/L (ref 11–51)

## 2022-07-21 NOTE — ED Triage Notes (Signed)
Pt reports frontal headaches for the past week, and BP is elevated. Pt reports at times she strains to see and feels off balance for the past week. Pt talks in complete sentences no distress noted

## 2022-07-22 ENCOUNTER — Emergency Department
Admission: EM | Admit: 2022-07-22 | Discharge: 2022-07-22 | Disposition: A | Discharge status: Home / Self Care | Payer: Medicare Other | Attending: Emergency Medicine | Admitting: Emergency Medicine

## 2022-07-22 ENCOUNTER — Emergency Department: Payer: Medicare Other

## 2022-07-22 DIAGNOSIS — I10 Essential (primary) hypertension: Secondary | ICD-10-CM | POA: Diagnosis not present

## 2022-07-22 DIAGNOSIS — R519 Headache, unspecified: Secondary | ICD-10-CM

## 2022-07-22 MED ORDER — BUTALBITAL-APAP-CAFFEINE 50-325-40 MG PO TABS
2.0000 | ORAL_TABLET | Freq: Once | ORAL | Status: AC
  Administered 2022-07-22: 2 via ORAL
  Filled 2022-07-22: qty 2

## 2022-07-22 NOTE — Discharge Instructions (Signed)
Use Tylenol for pain and fevers.  Up to 1000 mg per dose, up to 4 times per day.  Do not take more than 4000 mg of Tylenol/acetaminophen within 24 hours..  

## 2022-07-22 NOTE — ED Provider Notes (Signed)
[facility] Provider Note    Event Date/Time   First MD Initiated Contact with Patient 07/22/22 [phone]     (approximate)   History   Hypertension and Facial Pain   HPI  Liyla Radliff is a 71 y.o. female who presents to the ED for evaluation of Hypertension and Facial Pain   Patient presents to the ED for evaluation of an episode of high blood pressure that occurred this evening in the setting of about 1 week of intermittent bifrontal headache.  She reports pressure sensation to her bifrontal forehead over the past week, improved with Tylenol at home.   Reports that she checked her blood pressure this evening and it was 160/90, and due to this she presents to the ED for evaluation.     Physical Exam   Triage Vital Signs: ED Triage Vitals  Enc Vitals Group     BP 07/21/22 2056 (!) 144/72     Pulse Rate 07/21/22 2056 (!) 59     Resp 07/21/22 2056 16     Temp 07/21/22 2056 98.2 F (36.8 C)     Temp Source 07/21/22 2056 Oral     SpO2 07/21/22 2056 99 %     Weight 07/21/22 2101 260 lb (117.9 kg)     Height 07/21/22 2101 5\' 2"  (1.575 m)     Head Circumference --      Peak Flow --      Pain Score 07/21/22 2101 7     Pain Loc --      Pain Edu? --      Excl. in [place]? --     Most recent vital signs: Vitals:   07/21/22 2056  BP: (!) 144/72  Pulse: (!) 59  Resp: 16  Temp: 98.2 F (36.8 C)  SpO2: 99%    General: Awake, no distress.  Morbidly obese.  Sitting upright on the side of the bed.  Pleasant and conversational.  Looks systemically well.  Stands and ambulates with a normal gait independently. CV:  Good peripheral perfusion.  Resp:  Normal effort.  Abd:  No distention.  MSK:  No deformity noted.  Neuro:  No focal deficits appreciated. Cranial nerves II through XII intact 5/5 strength and sensation in all 4 extremities Other:     ED Results / Procedures / Treatments   Labs (all labs ordered are listed, but only abnormal results are  displayed) Labs Reviewed  COMPREHENSIVE METABOLIC PANEL - Abnormal; Notable for the following components:      Result Value   Glucose, Bld 125 (*)    All other components within normal limits  URINALYSIS, ROUTINE W REFLEX MICROSCOPIC - Abnormal; Notable for the following components:   Color, Urine YELLOW (*)    APPearance HAZY (*)    All other components within normal limits  CBC WITH DIFFERENTIAL/PLATELET  LIPASE, BLOOD    EKG   RADIOLOGY CT head interpreted by me without evidence of acute intracranial pathology  Official radiology report(s): CT Head Wo Contrast  Result Date: 07/22/2022 CLINICAL DATA:  Altered mental status EXAM: CT HEAD WITHOUT CONTRAST TECHNIQUE: Contiguous axial images were obtained from the base of the skull through the vertex without intravenous contrast. RADIATION DOSE REDUCTION: This exam was performed according to the departmental dose-optimization program which includes automated exposure control, adjustment of the mA and/or kV according to patient size and/or use of iterative reconstruction technique. COMPARISON:  09/25/2020 FINDINGS: Brain: No evidence of acute infarction, hemorrhage, hydrocephalus, extra-axial collection or  mass lesion/mass effect. Vascular: No hyperdense vessel or unexpected calcification. Skull: Normal. Negative for fracture or focal lesion. Sinuses/Orbits: No acute finding. Other: None. IMPRESSION: No acute intracranial abnormality noted. Electronically Signed   By: Alcide Clever M.D.   On: 07/22/2022 01:03    PROCEDURES and INTERVENTIONS:  Procedures  Medications  butalbital-acetaminophen-caffeine (FIORICET) 50-325-40 MG per tablet 2 tablet (2 tablets Oral Given 07/22/22 0116)     IMPRESSION / MDM / ASSESSMENT AND PLAN / ED COURSE  I reviewed the triage vital signs and the nursing notes.  Differential diagnosis includes, but is not limited to, stroke, ICH, sinusitis, migraine, tension headache, hypertensive emergency  {Patient  presents with symptoms of an acute illness or injury that is potentially life-threatening.  71 year old female presents to the ED with a week of waxing and waning headaches, with an episode of mild hypertension today, ultimately suitable for outpatient management.  She looks systemically well with an essentially normal examination.  Blood work is similarly reassuring with a normal CBC, metabolic panel, lipase and urinalysis.  CT head without evidence of ICH or other intracranial pathology.  Resolution of headache and improvement of her blood pressure after Fioricet.  We will discharge with close return precautions.  Clinical Course as of 07/22/22 0133  Sun Jul 22, 2022  0124 Reassessed.  Patient reports feeling better.  We discussed reassuring work-up, Tylenol at home as well as return precautions.  Answered questions. [DS]    Clinical Course User Index [DS] Delton Prairie, MD     FINAL CLINICAL IMPRESSION(S) / ED DIAGNOSES   Final diagnoses:  Primary hypertension  Frontal headache     Rx / DC Orders   ED Discharge Orders     None        Note:  This document was prepared using Dragon voice recognition software and may include unintentional dictation errors.   Delton Prairie, MD 07/22/22 [phone]

## 2022-08-01 ENCOUNTER — Other Ambulatory Visit: Payer: Self-pay | Admitting: Orthopaedic Surgery

## 2022-08-01 DIAGNOSIS — M47816 Spondylosis without myelopathy or radiculopathy, lumbar region: Secondary | ICD-10-CM

## 2022-08-13 ENCOUNTER — Ambulatory Visit
Admission: RE | Admit: 2022-08-13 | Discharge: 2022-08-13 | Disposition: A | Discharge status: Home / Self Care | Payer: Medicare Other | Source: Ambulatory Visit | Attending: Orthopaedic Surgery | Admitting: Orthopaedic Surgery

## 2022-08-13 DIAGNOSIS — M47816 Spondylosis without myelopathy or radiculopathy, lumbar region: Secondary | ICD-10-CM

## 2022-08-29 ENCOUNTER — Ambulatory Visit (INDEPENDENT_AMBULATORY_CARE_PROVIDER_SITE_OTHER): Payer: Medicare Other | Admitting: Primary Care

## 2022-08-29 DIAGNOSIS — Z8669 Personal history of other diseases of the nervous system and sense organs: Secondary | ICD-10-CM

## 2022-08-29 NOTE — Assessment & Plan Note (Addendum)
-   Resolved; Sleep study in February 2023 with Dr. Ron Parker showed no evidence of obstructive sleep apnea. Overnight oximetry report from March 2023 on room air showed no significant oxygen desaturations. No indication for oral appliance or CPAP. Encouraeg patient continue to work on weight loss efforts and focus on side sleeping position or elevate head of bed 30 degrees.  Advised patient avoid alcohol or sedating medications at bedtime as these can worsen underlying sleep apnea. Follow-up as needed if sleep symptoms worsen.

## 2022-08-29 NOTE — Patient Instructions (Signed)
Sleep study in February 2023 with Dr. Ron Parker showed no evidence of obstructive sleep apnea  Overnight oximetry report showed no evidence of oxygen desaturations   No indication for oral appliance or CPAP  Recommendations: - Continue to work on weight loss efforts and side sleeping position/elevate head of bed with adjustable bed frame 30 degrees - Avoid alcohol and sedating medications at bedtime as these can worsen underlying sleep apnea  Follow-up: -As needed only if sleep symptoms worsen

## 2022-08-29 NOTE — Progress Notes (Signed)
'@Patient'  ID: Cindy Anthony, female    DOB: Feb 10, 1951, 71 y.o.   MRN: [id number]  Chief Complaint  Patient presents with   Follow-up    Referring provider: Barbaraann Boys, MD  HPI: 71 year old female, never smoked. PMH significant for hypertension, obstructive sleep apnea, GERD.  Patient of Dr. Elsworth Soho. Diagnosed with sleep apnea in 2008, Cpap titration study in 2010 she had 22 events/hour supine position. Maintained on CPAP at 7 cm H2O with small nasal mask.  History of gastric bypass in 2010, peak weight 288 lbs. DME company is [employer].   Previous [facility] pulmonary encounter: 10/04/2020 Patient presents today for a 1 year follow-up OSA on CPAP. She is compliant with CPAP use, having some trouble recently. States that on four separate occasions she has woken up feeling short of breath/gasping for air. She is more tired. She wears nasal pillow mask. She is cleaning and changing out mask once a month. She does sleep with her mouth open.   Airview download 07/25/18-08/23/18 30/30 days used (100%); 28 days (93%) > 4 hours Average usage days used 7 hours 38 mins Pressure 7cm h20 Airleaks 20.2L/min AHI 1.6   11/07/2020  Patient contacted today for 1 month follow-up. During last visit she reports waking up on four separate occasional feeling short of breath/gasping for air. We increased her pressure to 8cm h20 and added a chin strap as she is a mouth breather. She is sleeping better. She has had one occurrence of waking up coughing at night. She brought her machine in to [place] and they increased CPAP pressure to 8cm. She did not get a chin strap but feels she would benefit from one. She recently changed out her nasal mask for a new one. 2  04/28/2021 Patient presents today for 6 month follow-up OSA. During last visit we change her CPAP pressure to 8cm h20 and placed an order for chin strap. She is still experiencing periods of choking and coughing at night, she wakes up on occasional feeling like she  can not breath. She wears nasal pillow mask. Unable to assess download. She gets approx 8-9 hours of sleep a night. She goes to sleep around 9pm and gets out of bed at 5am. She wakes up 2 times at night and is able to go right back to sleep. She sleeps with TV. Denies overt reflux symptoms.   08/11/2021 Patient contacted today for 3 month follow-up OSA. She is doing well. No issues with CPAP machine or mask. She never received chin strap and pressure setting was not changed to auto. Current CPAP pressure is 7cm h20. She is no longer having periods of choking or coughing at night. She does report occasional dry mouth and intermittent migraine headaches. CPAP. She is using humidification with her CPAP. She is following with neurology, takes acetaminophen-butalbital as needed 1-2 times a day.  She was [facility] ED on 07/07/21 for dyspnea on exertion. CXR showed clear lungs. She has no reports of shortness of breath with exertion. She is walking daily.   Airview download 07/25/21-08/23/21 30/30 days used; 93% > 4 hours Average usage days used 7 hours 38 mins Pressure 7cm h20 Airleaks 20.2L/min (95%) AHI 1.6  12/29/2021- interim hx  Patient presents today for acute OV/sleep apnea. Patient is having issues with CPAP, feels it is smothering her. She is not currently using CPAP, has not used in 8 months. We had previously tried adjusting her pressure. She does not want to try CPAP again. She feels  she is sleeping better without using CPAP.  She wakes up on average once a night. She does wake up tired. We discussed risk of untreated sleep apnea including cardiac arrhthymias, stroke, pulm HTN and DM. She is interested in other treatment options for her OSA. She is not open to surgical options. She is working on weight loss.    08/29/2022- Interim hx  Patient presents today for 6 month follow-up. Hx sleep apnea, intolerant to CPAP. She was referred for consideration for oral appliance. She had repeat  sleep testing with his office. HST on 01/12/22 from Dr. Ron Parker showed no evidence of obstructive sleep apnea. REI (AHI) 3.5. SpO2 low 62%, average was 94%.  Overnight pulse oximetry testing with virtual ox showed no evidence of significant nocturnal hypoxemia.  Patient spent less than 2 minutes with O2 level low 88%.   She is doing well today. She sleeps through the night without issues. No episodes of gasping or choking. She aims to get 7-8 hours of sleep a night. She feels rested most days. Shoulder pain keeps her awake. She has an adjustable bed frame that she use at an incline. She is walking on a daily bases. Denies morning headache, dry mouth or shortness of breath.   Allergies  Allergen Reactions   Amlodipine Diarrhea   Lisinopril Other (See Comments)    Dizziness   Other Other (See Comments)   Shellfish-Derived Products Other (See Comments)    Immunization History  Administered Date(s) Administered   Fluad Quad(high Dose 65+) 08/26/2020, 09/06/2021   Influenza, High Dose Seasonal PF 08/29/2017   Influenza,inj,Quad PF,6+ Mos 11/26/2014, 08/26/2015   Influenza-Unspecified 08/24/2016, 08/29/2017, 08/30/2018   MMR 02/04/1997   PFIZER Comirnaty(Gray Top)Covid-19 Tri-Sucrose Vaccine 01/16/2020, 02/07/2020, 09/16/2020, 04/18/2021, 08/15/2021   PFIZER(Purple Top)SARS-COV-2 Vaccination 08/15/2021   Pneumococcal Conjugate-13 03/20/2018   Pneumococcal Polysaccharide-23 05/20/2019, 08/15/2021   Tdap 11/26/2010   Varicella 10/17/2011   Zoster Recombinat (Shingrix) 11/30/2020, 02/24/2021   Zoster, Live 04/26/2014    Past Medical History:  Diagnosis Date   Anxiety    Arthritis    OSTEOARTHRITIS   Benign paroxysmal positional vertigo    Depression    GERD (gastroesophageal reflux disease)    Hemorrhoids    History of chicken pox    Hyperlipidemia    Hypertension    Piriformis syndrome of both sides    Sleep apnea     Tobacco History: Social History   Tobacco Use  Smoking  Status Never  Smokeless Tobacco Never   Counseling given: Not Answered   Outpatient Medications Prior to Visit  Medication Sig Dispense Refill   famotidine (PEPCID) 40 MG tablet Take 40 mg by mouth daily.     fenofibrate (TRICOR) 145 MG tablet Take 145 mg by mouth daily.     irbesartan (AVAPRO) 300 MG tablet Take 300 mg by mouth at bedtime.     spironolactone (ALDACTONE) 25 MG tablet Take 1 tablet by mouth daily.     Lidocaine 2 % GEL Apply 1 application topically 3 (three) times daily as needed. 30 g 1   metFORMIN (GLUCOPHAGE) 500 MG tablet Take by mouth.     nortriptyline (PAMELOR) 10 MG capsule Take 10 mg by mouth at bedtime.     oxyCODONE (OXY IR/ROXICODONE) 5 MG immediate release tablet 1 every 6-8 hours prn pain 12 tablet 0   No facility-administered medications prior to visit.   Review of Systems  Review of Systems  Constitutional: Negative.   HENT: Negative.    Respiratory:  Negative.     Physical Exam  There were no vitals taken for this visit. Physical Exam Constitutional:      Appearance: Normal appearance.  HENT:     Head: Normocephalic and atraumatic.     Mouth/Throat:     Mouth: Mucous membranes are moist.     Pharynx: Oropharynx is clear.  Cardiovascular:     Rate and Rhythm: Normal rate and regular rhythm.  Pulmonary:     Effort: Pulmonary effort is normal.     Breath sounds: Normal breath sounds.  Skin:    General: Skin is warm and dry.  Neurological:     General: No focal deficit present.     Mental Status: She is alert and oriented to person, place, and time. Mental status is at baseline.  Psychiatric:        Mood and Affect: Mood normal.        Behavior: Behavior normal.        Thought Content: Thought content normal.        Judgment: Judgment normal.      Lab Results:  CBC    Component Value Date/Time   WBC 9.5 07/21/2022 2136   RBC 4.64 07/21/2022 2136   HGB 13.8 07/21/2022 2136   HCT 43.3 07/21/2022 2136   PLT 244 07/21/2022 2136    MCV 93.3 07/21/2022 2136   MCH 29.7 07/21/2022 2136   MCHC 31.9 07/21/2022 2136   RDW 13.9 07/21/2022 2136   LYMPHSABS 2.1 07/21/2022 2136   MONOABS 0.9 07/21/2022 2136   EOSABS 0.1 07/21/2022 2136   BASOSABS 0.0 07/21/2022 2136    BMET    Component Value Date/Time   NA 141 07/21/2022 2136   K 4.3 07/21/2022 2136   CL 109 07/21/2022 2136   CO2 24 07/21/2022 2136   GLUCOSE 125 (H) 07/21/2022 2136   BUN 14 07/21/2022 2136   CREATININE 0.80 07/21/2022 2136   CALCIUM 9.1 07/21/2022 2136   GFRNONAA >60 07/21/2022 2136   GFRAA >60 03/01/2017 2330    BNP No results found for: "BNP"  ProBNP No results found for: "PROBNP"  Imaging: MR LUMBAR SPINE WO CONTRAST  Result Date: 08/15/2022 CLINICAL DATA:  Left buttock pain EXAM: MRI LUMBAR SPINE WITHOUT CONTRAST TECHNIQUE: Multiplanar, multisequence MR imaging of the lumbar spine was performed. No intravenous contrast was administered. COMPARISON:  01/11/2017 FINDINGS: Segmentation: The lowest lumbar type non-rib-bearing vertebra is labeled as L5. Alignment:  No vertebral subluxation is observed. Vertebrae: Hemangioma in the L2 vertebral body. Additional hemangioma T11. Bilateral facet effusions at L4-5 and L5-S1. Disc desiccation at L3-4, L4-5, L5-S1. Mild prominence of epidural adipose tissues in the lower lumbar spine. Conus medullaris and cauda equina: Conus extends to the L1 level. Conus and cauda equina appear normal. Incidental thin fatty filum. Paraspinal and other soft tissues: A fluid signal intensity lesion of the left kidney is partially characterized on today's exam. This is statistically likely to be a cyst, and no follow up imaging is recommended. Disc levels: T11-12: No impingement.  Disc bulge. T12-L1: No impingement.  Small right paracentral disc protrusion. L1-2: Unremarkable L2-3: Unremarkable. L3-4: Mild to moderate bilateral foraminal stenosis and mild central narrowing of the thecal sac due to disc bulge and facet  arthropathy along with mildly short pedicles. L4-5: Mild central narrowing of the thecal sac and borderline left foraminal stenosis due to disc bulge, facet arthropathy, and short pedicles. Left greater than right facet joint effusions at this level. L5-S1: Moderate left foraminal stenosis  and borderline left subarticular lateral recess stenosis due to facet arthropathy and disc bulge. Bilateral facet joint effusions. IMPRESSION: 1. Lumbar spondylosis and degenerative disc disease, causing moderate impingement at L5-S1; mild to moderate impingement at L3-4; and mild impingement at L4-5, as detailed above. 2. Mildly congenitally short pedicles and prominence of epidural adipose tissue in the lower lumbar spine. 3. Bilateral facet effusions at L4-5 and L5-S1. Electronically Signed   By: Van Clines M.D.   On: 08/15/2022 09:33     Assessment & Plan:   Hx of sleep apnea - Resolved; Sleep study in February 2023 with Dr. Ron Parker showed no evidence of obstructive sleep apnea. Overnight oximetry report from March 2023 on room air showed no significant oxygen desaturations. No indication for oral appliance or CPAP. Encouraeg patient continue to work on weight loss efforts and focus on side sleeping position or elevate head of bed 30 degrees.  Advised patient avoid alcohol or sedating medications at bedtime as these can worsen underlying sleep apnea. Follow-up as needed if sleep symptoms worsen.   Martyn Ehrich, NP 08/29/2022

## 2023-01-20 ENCOUNTER — Encounter: Payer: Self-pay | Admitting: Emergency Medicine

## 2023-01-20 ENCOUNTER — Emergency Department
Admission: EM | Admit: 2023-01-20 | Discharge: 2023-01-20 | Disposition: A | Discharge status: Home / Self Care | Payer: Medicare Other | Attending: Emergency Medicine | Admitting: Emergency Medicine

## 2023-01-20 ENCOUNTER — Other Ambulatory Visit: Payer: Self-pay

## 2023-01-20 DIAGNOSIS — I10 Essential (primary) hypertension: Secondary | ICD-10-CM | POA: Insufficient documentation

## 2023-01-20 LAB — BASIC METABOLIC PANEL
Anion gap: 9 (ref 5–15)
BUN: 11 mg/dL (ref 8–23)
CO2: 24 mmol/L (ref 22–32)
Calcium: 9.3 mg/dL (ref 8.9–10.3)
Chloride: 103 mmol/L (ref 98–111)
Creatinine, Ser: 0.84 mg/dL (ref 0.44–1.00)
GFR, Estimated: >60 mL/min (ref >60)
Glucose, Bld: 111 mg/dL — ABNORMAL HIGH (ref 70–99)
Potassium: 3.8 mmol/L (ref 3.5–5.1)
Sodium: 136 mmol/L (ref 135–145)

## 2023-01-20 LAB — CBC
HCT: 42.5 % (ref 36.0–46.0)
Hemoglobin: 13.6 g/dL (ref 12.0–15.0)
MCH: 29.7 pg (ref 26.0–34.0)
MCHC: 32 g/dL (ref 30.0–36.0)
MCV: 92.8 fL (ref 80.0–100.0)
Platelets: 296 10*3/uL (ref 150–400)
RBC: 4.58 MIL/uL (ref 3.87–5.11)
RDW: 14.3 % (ref 11.5–15.5)
WBC: 9.9 10*3/uL (ref 4.0–10.5)
nRBC: 0 % (ref 0.0–0.2)

## 2023-01-20 NOTE — ED Triage Notes (Addendum)
Pt via POV from home. Pt states her BP has been running high. Pt was seen at [facility] this morning and BP was 123456 systolic. States that has been recent changes to her blood pressure medication. Pt reports dizziness also. Pt is A&OX4 and NAD. Ambulatory to triage.

## 2023-01-20 NOTE — ED Provider Notes (Signed)
[facility] Provider Note    Event Date/Time   First MD Initiated Contact with Patient 01/20/23 1150     (approximate)   History   Hypertension   HPI  Cindy Anthony is a 72 y.o. female with a past history of GERD, hypertension, obesity who comes ED due to elevated blood pressure.  She reports that she had previously been on a regimen of irbesartan 300 mg nightly and spironolactone, and then several months ago she was started on fenofibrate which was associated with blood pressure getting low.  Her antihypertensives were scaled back to 150 mg irbesartan and discontinued spironolactone.  A few weeks ago, she ran out of fenofibrate and it was not refilled by her primary care doctor.  She went to urgent care this morning due to UTI symptoms, was found to have a blood pressure of about 170/100.  She was prescribed Macrobid and told not to worry about the blood pressure for now, but when she went home and rechecked it herself she got a systolic blood pressure of 200 and comes to the ED for evaluation.  No chest pain or shortness of breath.  No headache vision changes paresthesias or motor weakness.  No change in balance or coordination.     Physical Exam   Triage Vital Signs: ED Triage Vitals  Enc Vitals Group     BP 01/20/23 1131 (!) 182/75     Pulse Rate 01/20/23 1131 73     Resp 01/20/23 1131 20     Temp 01/20/23 1131 98.4 F (36.9 C)     Temp Source 01/20/23 1131 Oral     SpO2 01/20/23 1131 100 %     Weight 01/20/23 1129 266 lb (120.7 kg)     Height 01/20/23 1129 '5\' 3"'$  (1.6 m)     Head Circumference --      Peak Flow --      Pain Score 01/20/23 1129 0     Pain Loc --      Pain Edu? --      Excl. in [place]? --     Most recent vital signs: Vitals:   01/20/23 1131  BP: (!) 182/75  Pulse: 73  Resp: 20  Temp: 98.4 F (36.9 C)  SpO2: 100%    General: Awake, no distress.  CV:  Good peripheral perfusion.  Regular rate and rhythm Resp:  Normal  effort.  Clear to auscultation bilaterally Abd:  No distention.  Other:  Cranial nerves III through XII intact.  No nystagmus.  PERRL, EOMI.  Normal motor and cerebellar function.   ED Results / Procedures / Treatments   Labs (all labs ordered are listed, but only abnormal results are displayed) Labs Reviewed  BASIC METABOLIC PANEL - Abnormal; Notable for the following components:      Result Value   Glucose, Bld 111 (*)    All other components within normal limits  CBC     RADIOLOGY    PROCEDURES:  Procedures   MEDICATIONS ORDERED IN ED: Medications - No data to display   IMPRESSION / MDM / [place] / ED COURSE  I reviewed the triage vital signs and the nursing notes.                              Differential diagnosis includes, but is not limited to, AKI, electrolyte abnormality,, anemia, uncontrolled hypertension  Patient presents with severe exacerbation of chronic  disease  Patient presents with uncontrolled hypertension, blood pressure of 180/75 in the ED.  No significant symptoms.  Labs are unremarkable.  Exam is normal.  By history, since her fenofibrate is discontinued, she should revert back to her previous antihypertensive regimen of irbesartan 300 mg and spironolactone.  She already restarted the spironolactone this morning.  She will resume full dose irbesartan until she follows up with her PCP at her appointment next month.  No evidence of stroke or intracranial hemorrhage or mass or dissection or ACS.     FINAL CLINICAL IMPRESSION(S) / ED DIAGNOSES   Final diagnoses:  Uncontrolled hypertension     Rx / DC Orders   ED Discharge Orders     None        Note:  This document was prepared using Dragon voice recognition software and may include unintentional dictation errors.   Carrie Mew, MD 01/20/23 [phone]

## 2023-07-08 ENCOUNTER — Other Ambulatory Visit: Payer: Self-pay | Admitting: Internal Medicine

## 2023-07-08 DIAGNOSIS — R0609 Other forms of dyspnea: Secondary | ICD-10-CM

## 2023-07-12 ENCOUNTER — Ambulatory Visit (INDEPENDENT_AMBULATORY_CARE_PROVIDER_SITE_OTHER): Payer: Medicare Other

## 2023-07-12 DIAGNOSIS — R0609 Other forms of dyspnea: Secondary | ICD-10-CM

## 2023-07-12 MED ORDER — TECHNETIUM TC 99M SESTAMIBI GENERIC - CARDIOLITE
10.3000 | Freq: Once | INTRAVENOUS | Status: AC | PRN
  Administered 2023-07-12: 10.3 via INTRAVENOUS

## 2023-07-12 MED ORDER — TECHNETIUM TC 99M SESTAMIBI GENERIC - CARDIOLITE
31.8000 | Freq: Once | INTRAVENOUS | Status: AC | PRN
  Administered 2023-07-12: 31.8 via INTRAVENOUS

## 2024-03-15 ENCOUNTER — Emergency Department

## 2024-03-15 ENCOUNTER — Emergency Department: Admission: EM | Admit: 2024-03-15 | Discharge: 2024-03-15 | Disposition: A | Discharge status: Home / Self Care

## 2024-03-15 ENCOUNTER — Other Ambulatory Visit: Payer: Self-pay

## 2024-03-15 ENCOUNTER — Encounter: Payer: Self-pay | Admitting: Emergency Medicine

## 2024-03-15 DIAGNOSIS — T887XXA Unspecified adverse effect of drug or medicament, initial encounter: Secondary | ICD-10-CM

## 2024-03-15 DIAGNOSIS — I10 Essential (primary) hypertension: Secondary | ICD-10-CM | POA: Diagnosis not present

## 2024-03-15 DIAGNOSIS — R52 Pain, unspecified: Secondary | ICD-10-CM

## 2024-03-15 DIAGNOSIS — G8929 Other chronic pain: Secondary | ICD-10-CM | POA: Insufficient documentation

## 2024-03-15 DIAGNOSIS — R5381 Other malaise: Secondary | ICD-10-CM | POA: Insufficient documentation

## 2024-03-15 DIAGNOSIS — R0602 Shortness of breath: Secondary | ICD-10-CM | POA: Insufficient documentation

## 2024-03-15 DIAGNOSIS — M25511 Pain in right shoulder: Secondary | ICD-10-CM | POA: Insufficient documentation

## 2024-03-15 DIAGNOSIS — T481X5A Adverse effect of skeletal muscle relaxants [neuromuscular blocking agents], initial encounter: Secondary | ICD-10-CM | POA: Diagnosis not present

## 2024-03-15 LAB — BASIC METABOLIC PANEL WITH GFR
Anion gap: 7 (ref 5–15)
BUN: 27 mg/dL — ABNORMAL HIGH (ref 8–23)
CO2: 22 mmol/L (ref 22–32)
Calcium: 9.2 mg/dL (ref 8.9–10.3)
Chloride: 107 mmol/L (ref 98–111)
Creatinine, Ser: 0.98 mg/dL (ref 0.44–1.00)
GFR, Estimated: >60 mL/min (ref >60)
Glucose, Bld: 111 mg/dL — ABNORMAL HIGH (ref 70–99)
Potassium: 4 mmol/L (ref 3.5–5.1)
Sodium: 136 mmol/L (ref 135–145)

## 2024-03-15 LAB — CBC
HCT: 40.2 % (ref 36.0–46.0)
Hemoglobin: 13.7 g/dL (ref 12.0–15.0)
MCH: 31.8 pg (ref 26.0–34.0)
MCHC: 34.1 g/dL (ref 30.0–36.0)
MCV: 93.3 fL (ref 80.0–100.0)
Platelets: 266 10*3/uL (ref 150–400)
RBC: 4.31 MIL/uL (ref 3.87–5.11)
RDW: 14.4 % (ref 11.5–15.5)
WBC: 10.4 10*3/uL (ref 4.0–10.5)
nRBC: 0 % (ref 0.0–0.2)

## 2024-03-15 LAB — TROPONIN I (HIGH SENSITIVITY)
Troponin I (High Sensitivity): 3 ng/L (ref <18)
Troponin I (High Sensitivity): 4 ng/L (ref <18)

## 2024-03-15 LAB — BRAIN NATRIURETIC PEPTIDE: B Natriuretic Peptide: 49.8 pg/mL (ref 0.0–100.0)

## 2024-03-15 MED ORDER — LIDOCAINE 5 % EX PTCH
1.0000 | MEDICATED_PATCH | CUTANEOUS | 0 refills | Status: AC
Start: 2024-03-15 — End: 2024-03-25

## 2024-03-15 MED ORDER — LIDOCAINE 5 % EX PTCH
1.0000 | MEDICATED_PATCH | CUTANEOUS | Status: DC
  Administered 2024-03-15: 1 via TRANSDERMAL
  Filled 2024-03-15: qty 1

## 2024-03-15 MED ORDER — KETOROLAC TROMETHAMINE 15 MG/ML IJ SOLN
15.0000 mg | Freq: Once | INTRAMUSCULAR | Status: AC
  Administered 2024-03-15: 15 mg via INTRAVENOUS
  Filled 2024-03-15: qty 1

## 2024-03-15 MED ORDER — ONDANSETRON HCL 4 MG/2ML IJ SOLN
4.0000 mg | Freq: Once | INTRAMUSCULAR | Status: AC
  Administered 2024-03-15: 4 mg via INTRAVENOUS
  Filled 2024-03-15: qty 2

## 2024-03-15 NOTE — ED Triage Notes (Addendum)
 Pt via POV from home. Pt states she has been having SOB since she took muscle relaxants today and yesterday. Pain reported in the R shoulder and states this is why she received the muscle relaxants. Denies CP. Denies cough. Denies fever. Denies any sick contacts. Pt is A&Ox4 and NAD, ambulatory to triage.

## 2024-03-15 NOTE — ED Provider Notes (Signed)
 [facility] Provider Note    Event Date/Time   First MD Initiated Contact with Patient 03/15/24 1458     (approximate)   History   Shortness of Breath  Pt via POV from home. Pt states she has been having SOB since she took muscle relaxants today and yesterday. Pain reported in the R shoulder and states this is why she received the muscle relaxants. Denies CP. Denies cough. Denies fever. Denies any sick contacts. Pt is A&Ox4 and NAD, ambulatory to triage.    HPI Cindy Anthony is a 73 y.o. female PMH sleep apnea, GERD, hypertension, BMI >40 presents for evaluation of shortness of breath - Patient tells me she recently had an injection into her right shoulder, known rotator cuff injuries to the shoulder.  Is followed by orthopedics for this.  Was recently prescribed methocarbamol.  Took this for the first time yesterday and then again today around noon.  Both times she felt "woozy" with some mild nausea and shortness of breath so decided to come to emergency department for evaluation. -No chest pain, fever, vomiting, diarrhea, abdominal pain -Otherwise has been in her usual state of health     Physical Exam   Triage Vital Signs: ED Triage Vitals  Encounter Vitals Group     BP 03/15/24 1418 (!) 155/64     Systolic BP Percentile --      Diastolic BP Percentile --      Pulse Rate 03/15/24 1418 88     Resp 03/15/24 1418 18     Temp 03/15/24 1418 98.6 F (37 C)     Temp Source 03/15/24 1418 Oral     SpO2 03/15/24 1418 100 %     Weight 03/15/24 1417 250 lb (113.4 kg)     Height 03/15/24 1417 5\' 3"  (1.6 m)     Head Circumference --      Peak Flow --      Pain Score 03/15/24 1417 10     Pain Loc --      Pain Education --      Exclude from Growth Chart --     Most recent vital signs: Vitals:   03/15/24 1418  BP: (!) 155/64  Pulse: 88  Resp: 18  Temp: 98.6 F (37 C)  SpO2: 100%     General: Awake, no distress.  CV:  Good peripheral perfusion.  RRR, RP 2+ Resp:  Normal effort. CTAB Abd:  No distention. Nontender to deep palpation throughout    ED Results / Procedures / Treatments   Labs (all labs ordered are listed, but only abnormal results are displayed) Labs Reviewed  BASIC METABOLIC PANEL WITH GFR - Abnormal; Notable for the following components:      Result Value   Glucose, Bld 111 (*)    BUN 27 (*)    All other components within normal limits  CBC  BRAIN NATRIURETIC PEPTIDE  TROPONIN I (HIGH SENSITIVITY)  TROPONIN I (HIGH SENSITIVITY)     EKG  Ecg = sinus rhythm, rate 82, no ST elevation or depression, no significant repolarization abnormality, normal axis, normal intervals.  No clear evidence of ischemia nor arrhythmia on my read.   RADIOLOGY Chest x-ray with no pathology on my interpretation.  Formal radiology report later reviewed.  PROCEDURES:  Critical Care performed: No  Procedures   MEDICATIONS ORDERED IN ED: Medications  lidocaine  (LIDODERM ) 5 % 1 patch (1 patch Transdermal Patch Applied 03/15/24 1604)  ketorolac  (TORADOL ) 15 MG/ML injection 15  mg (15 mg Intravenous Given 03/15/24 1603)  ondansetron  (ZOFRAN ) injection 4 mg (4 mg Intravenous Given 03/15/24 1604)     IMPRESSION / MDM / ASSESSMENT AND PLAN / ED COURSE  I reviewed the triage vital signs and the nursing notes.                              DDX/MDM/AP: Differential diagnosis includes, but is not limited to, likely medication side effect given clear association with new medication with known similar side effects.  Consider ACS, doubt underlying pneumonia, CHF COPD.  Do not suspect acute intra-abdominal pathology at this time either.  Plan for labs, delta troponin if initial unremarkable, EKG, Zofran  and reassess.  Patient requesting pain medications for her chronic right shoulder pain, will trial Toradol  and lidocaine .  Plan: - Labs  -chest x-ray - Meds - EKG - Reassess  Patient's presentation is most consistent with acute  presentation with potential threat to life or bodily function.  The patient is on the cardiac monitor to evaluate for evidence of arrhythmia and/or significant heart rate changes.  ED course below.  Workup overall reassuring.  Had been prescribed methocarbamol in the past but tells me she had never taken it until the past 24 hours and it's use did line up clearly with her symptoms of vague wooziness.  Workup reassuring.  Counseled to stop methocarbamol.  Rx lidocaine  patches for ongoing chronic right shoulder discomfort for which she is already seeing orthopedics.  Plan for PMD follow-up as well.  ED return precautions in place.  Patient agrees with plan.  Clinical Course as of 03/15/24 1819  Sun Mar 15, 2024  1609 Chest x-ray interpreted by myself, unremarkable  Radiology report reviewed, in agreement [MM]  1757 Rpt trop stable, will proceed with discharge.  Suspect cardiac etiology.  Presentation overall most consistent with medication side effect from methocarbamol.  Will plan to discontinue. [MM]  1810 Reevaluated, feeling well, discussed reassuring workup.  Do suspect she is likely having a side effect of feeling "woozy" after taking methocarbamol, symptoms have only presented shortly after taking the medication and never otherwise not in any specific anginal pattern with reassuring cardiac workup.  Today.  Serial abdominal exams benign, lab test unremarkable.  Is feeling better after lidocaine  patch here, will prescribe, plan to continue Tylenol  in addition for her chronic right shoulder pain and follow-up with her PMD and orthopedics.  ED return precautions in place.  Patient agrees with plan. [MM]    Clinical Course User Index [MM] Collis Deaner, MD     FINAL CLINICAL IMPRESSION(S) / ED DIAGNOSES   Final diagnoses:  Medication side effect  A vague feeling of discomfort  Chronic right shoulder pain     Rx / DC Orders   ED Discharge Orders          Ordered    lidocaine   (LIDODERM ) 5 %  Every 24 hours        03/15/24 1817             Note:  This document was prepared using Dragon voice recognition software and may include unintentional dictation errors.   Collis Deaner, MD 03/15/24 [phone]

## 2024-03-15 NOTE — Discharge Instructions (Addendum)
 Your evaluation in the emergency department was overall reassuring, and I suspect your vague sensation of wooziness after taking the methocarbamol most likely medication side effect.  I recommend you stop taking the methocarbamol, and I have prescribed you a topical numbing patch that you can put on your shoulder to help with your discomfort.  Please do follow-up with your primary care doctor and orthopedist for reevaluation, and return to the emergency department with any new or worsening symptoms.

## 2024-08-24 ENCOUNTER — Ambulatory Visit: Attending: Neurology

## 2024-08-24 DIAGNOSIS — M5382 Other specified dorsopathies, cervical region: Secondary | ICD-10-CM | POA: Insufficient documentation

## 2024-08-24 DIAGNOSIS — G44221 Chronic tension-type headache, intractable: Secondary | ICD-10-CM | POA: Diagnosis present

## 2024-08-24 DIAGNOSIS — M6281 Muscle weakness (generalized): Secondary | ICD-10-CM | POA: Insufficient documentation

## 2024-08-24 NOTE — Therapy (Signed)
 OUTPATIENT PHYSICAL THERAPY NEURO EVALUATION   Patient Name: Cindy Anthony MRN: [id number] DOB:1951-04-11, 73 y.o., female Today's Date: 08/25/2024   PCP: Dr. Vernell Lied REFERRING PROVIDER: Dr. Arthea Farrow  END OF SESSION:  PT End of Session - 08/24/24 1542     Visit Number 1    Number of Visits 24    Date for Recertification  11/16/24    Progress Note Due on Visit 10    PT Start Time 1534    PT Stop Time 1615    PT Time Calculation (min) 41 min    Activity Tolerance Patient tolerated treatment well    Behavior During Therapy [facility] for tasks assessed/performed          Past Medical History:  Diagnosis Date   Anxiety    Arthritis    OSTEOARTHRITIS   Benign paroxysmal positional vertigo    Depression    GERD (gastroesophageal reflux disease)    Hemorrhoids    History of chicken pox    Hyperlipidemia    Hypertension    Piriformis syndrome of both sides    Sleep apnea    Past Surgical History:  Procedure Laterality Date   ABDOMINAL HYSTERECTOMY     BARIATRIC SURGERY  2013   sleeve   COLONOSCOPY WITH PROPOFOL  N/A 11/28/2018   Procedure: COLONOSCOPY WITH PROPOFOL ;  Surgeon: Viktoria Lamar DASEN, MD;  Location: [facility] ENDOSCOPY;  Service: Endoscopy;  Laterality: N/A;   ESOPHAGOGASTRODUODENOSCOPY (EGD) WITH PROPOFOL  N/A 11/28/2018   Procedure: ESOPHAGOGASTRODUODENOSCOPY (EGD) WITH PROPOFOL ;  Surgeon: Viktoria Lamar DASEN, MD;  Location: [facility] ENDOSCOPY;  Service: Endoscopy;  Laterality: N/A;   JOINT REPLACEMENT     SHOULDER ARTHROSCOPY Right 2007   TOTAL KNEE ARTHROPLASTY Right 2009   Patient Active Problem List   Diagnosis Date Noted   Chronic mesenteric ischemia 07/25/2015   Bacteriuria with pyuria 07/03/2015   Left flank pain, chronic 06/30/2015   Essential hypertension 05/12/2015   Morbid obesity (HCC) 03/03/2015   Hip pain 03/03/2015   GERD (gastroesophageal reflux disease) 03/03/2015   Hx of sleep apnea 03/03/2015    ONSET DATE: 07/27/2024  REFERRING DIAG:  chronic headaches  THERAPY DIAG:  Chronic tension-type headache, intractable - Plan: PT plan of care cert/re-cert  Muscle weakness (generalized) - Plan: PT plan of care cert/re-cert  Limited active range of motion (AROM) of cervical spine on lateral flexion - Plan: PT plan of care cert/re-cert  Rationale for Evaluation and Treatment: Rehabilitation  SUBJECTIVE:  SUBJECTIVE STATEMENT: Patient reports her MD sent her to PT in the hopes of helping her decrease the frequency and intensity of her tension headaches. Patient reports dealing with frontal and temporal headache symptoms for past 3 weeks. States the MD does not think it is sinus related and would like to try PT and may include dry needling as mentioned by her MD.  Pt accompanied by: self  PERTINENT HISTORY:   PER MD Note: Patient presents for urgent follow-up after recent ED visit for worsening headaches. Patient has been experiencing daily headaches for the last 2 weeks which she describes as throbbing pressure in frontal and bitemporal regions. Denies associated photophobia, phonophobia. She has been evaluated by ENT reports workup was reassuring, without noticeable sinus issues. See above section for PMH   PAIN:  Are you having pain? Yes: NPRS scale: No pain currently- intermittent headaches - Frontal and temporal- worst= 10/10 and best = 0/10.  Pain location: frontal headaches and temporal headaches Pain description: throbbing, pressure, dull (no recent dizziness in past 2 weeks)  Aggravating factors: Too much light, sitting at computer, high stress and anxiety.  Relieving factors: Medication and rest- laying down.   PRECAUTIONS: None  RED FLAGS: None   WEIGHT BEARING RESTRICTIONS: No  FALLS: Has patient fallen in last 6 months?  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   OBJECTIVE:  Note: Objective measures were completed at Evaluation unless otherwise noted.  DIAGNOSTIC FINDINGS: 08/08/2024 CT brain without contrast IMPRESSION:  No acute intracranial abnormalities.   COGNITION: Overall cognitive status: Within functional limits for tasks assessed   SENSATION: WFL  COORDINATION: WNL  EDEMA:  None  POSTURE: rounded shoulders and forward head  Palpation +tenderness along B Upper trap with multiple taut bands noted   Strength R/L 4/4 Shoulder flexion (anterior deltoid/pec major/coracobrachialis, axillary n. (C5/6) and musculocutaneous n. (C5-7)) 4/4 Shoulder abduction (deltoid/supraspinatus, axillary/suprascapular n, C5) 5/5 Shoulder external rotation (infraspinatus/teres minor) 5/5 Shoulder internal rotation (subcapularis/lats/pec major) 5/5 Shoulder extension (posterior deltoid, lats, teres major, axillary/thoracodorsal n.) 5/5 Shoulder horizontal abduction 5/5 Elbow flexion (biceps brachii, brachialis, brachioradialis, musculoskeletal n, C5/6) 5/5 Elbow extension (triceps, radial n, C7)   AROM R/L 43 Cervical Flexion 78 Cervical Extension *25/*28 Cervical Lateral Flexion 78/80 Cervical Rotation *Indicates pain, overpressure performed unless otherwise indicated  BED MOBILITY:  Not tested  TRANSFERS: INDEPENDENT  RAMP:  Not tested  CURB:  Not tested  STAIRS: Not tested GAIT: Independent  FUNCTIONAL TESTS:    PATIENT SURVEYS:  none                                                                                                                              TREATMENT DATE: 08/24/2024   PT Evaluation  Self care/home  management:    Pain management discussion- Dicussed use of modalities including heat. Discussed ice as well for pain, inflammation and properties of vasoconstriction.   Trigger Point Dry Needling- *Discussed today and issues handout but DID NOT perform  Initial Treatment: Pt instructed on Dry Needling rational, procedures, and possible side effects. Pt instructed to expect mild to moderate muscle soreness later in the day and/or into the next day.  Pt instructed in methods to reduce muscle soreness. Pt instructed to continue prescribed HEP. Because Dry Needling was performed over or adjacent to a lung field, pt was educated on S/S of pneumothorax and to seek immediate medical attention should they occur.  Patient was educated on signs and symptoms of infection and other risk factors and advised to seek medical attention should they occur.  Patient verbalized understanding of these instructions and education.     PATIENT EDUCATION: Education details: Purpose of PT; plan of care; Modalities and pain management education, Purpose and indication of DN, importance of posture to decrease tension for possible improvement in headache relief  Person educated: Patient Education method: Explanation, Demonstration, Tactile cues, Verbal cues, and Handouts Education comprehension: verbalized understanding, returned demonstration, verbal cues required, tactile cues required, and needs further education  HOME EXERCISE PROGRAM: To be initiated next visit  GOALS: Goals reviewed with patient? Yes  SHORT TERM GOALS: Target date: 11/16/2024  Pt will be independent with HEP in order to improve strength and decrease back pain in order to improve pain-free function at home and work. Baseline: EVAL - No formal HEP In place Goal status: INITIAL   LONG TERM GOALS: Target date: 11/16/2022  Patient will demonstrate improved sitting and standing posture in clinic 90% of time for overall assist with less tension in neck  and upper back Baseline: EVAL- Poor postural awareness with excessive forward head and rounded shoulders Goal status: INITIAL  2.  Patient will report decreased risk of headaches by at 25% for improved function and quality of life and home and work.  Baseline: EVAL: Patient reports daily headaches Goal status: INITIAL  3.  Patient will demonstrate improved cervical lateral flexion by 10 deg AROM for improved head motion at home and work. Baseline: EVAL= R/L=*25/*28  Goal status: INITIAL  4. Pt will report decreased head pain as reported on NPRS by at least 2 points at worst in order to demonstrate clinically significant reduction in back pain.  Baseline: EVAL= 10/10 at worst Goal status: INITIAL   ASSESSMENT:  CLINICAL IMPRESSION: Patient is a 73 y.o. female who was seen today for physical therapy evaluation and treatment for chronic tension headahes. Pt presents with deficits in UE strength, mobility, cervical range of motion, and pain with poor overall posture and awareness. Pt will benefit from skilled PT services to address deficits and return to pain-free function at home and work.   OBJECTIVE IMPAIRMENTS: decreased activity tolerance, decreased mobility, decreased ROM, decreased strength, hypomobility, increased fascial restrictions, impaired flexibility, impaired UE functional use, postural dysfunction, and pain.   ACTIVITY LIMITATIONS: carrying and lifting  PARTICIPATION LIMITATIONS: meal prep, cleaning, laundry, shopping, community activity, occupation, yard work, and church  PERSONAL FACTORS: 1-2 comorbidities: Anxiety and OA are also affecting patient's functional outcome.   REHAB POTENTIAL: Good  CLINICAL DECISION MAKING: Stable/uncomplicated  EVALUATION COMPLEXITY: Low  PLAN:  PT FREQUENCY: 1-2x/week  PT DURATION: 12 weeks  PLANNED INTERVENTIONS: 97164- PT Re-evaluation, 97750- Physical Performance Testing, 97110-Therapeutic exercises, 97530- Therapeutic  activity, [id number]- Neuromuscular re-education, 97535- Self Care, 02859- Manual therapy,  04007- Canalith repositioning, 02967- Electrical stimulation (manual), [id number]- Traction (mechanical), [id number] (1-2 muscles), 20561 (3+ muscles)- Dry Needling, Patient/Family education, Joint mobilization, Joint manipulation, Spinal manipulation, Spinal mobilization, Vestibular training, Cryotherapy, and Moist heat  PLAN FOR NEXT SESSION:  Manual therapy for ROM pain relief Instruct in Cervical stretching Instruct in posture and body  mechanics Modalities as needed for pain management Dry needling for pain/hypomobility/trigger points   Reyes LOISE London, PT 08/25/2024, 6:19 PM

## 2024-08-27 NOTE — Therapy (Signed)
 OUTPATIENT PHYSICAL THERAPY NEURO TREATMENT   Patient Name: Cindy Anthony MRN: [id number] DOB:1951/07/01, 73 y.o., female Today's Date: 08/28/2024   PCP: Dr. Vernell Lied REFERRING PROVIDER: Dr. Arthea Farrow  END OF SESSION:  PT End of Session - 08/28/24 0757     Visit Number 2    Number of Visits 24    Date for Recertification  11/16/24    Progress Note Due on Visit 10    PT Start Time 0757    PT Stop Time 0830    PT Time Calculation (min) 33 min    Activity Tolerance Patient tolerated treatment well    Behavior During Therapy [facility] for tasks assessed/performed           Past Medical History:  Diagnosis Date   Anxiety    Arthritis    OSTEOARTHRITIS   Benign paroxysmal positional vertigo    Depression    GERD (gastroesophageal reflux disease)    Hemorrhoids    History of chicken pox    Hyperlipidemia    Hypertension    Piriformis syndrome of both sides    Sleep apnea    Past Surgical History:  Procedure Laterality Date   ABDOMINAL HYSTERECTOMY     BARIATRIC SURGERY  2013   sleeve   COLONOSCOPY WITH PROPOFOL  N/A 11/28/2018   Procedure: COLONOSCOPY WITH PROPOFOL ;  Surgeon: Viktoria Lamar DASEN, MD;  Location: [facility] ENDOSCOPY;  Service: Endoscopy;  Laterality: N/A;   ESOPHAGOGASTRODUODENOSCOPY (EGD) WITH PROPOFOL  N/A 11/28/2018   Procedure: ESOPHAGOGASTRODUODENOSCOPY (EGD) WITH PROPOFOL ;  Surgeon: Viktoria Lamar DASEN, MD;  Location: [facility] ENDOSCOPY;  Service: Endoscopy;  Laterality: N/A;   JOINT REPLACEMENT     SHOULDER ARTHROSCOPY Right 2007   TOTAL KNEE ARTHROPLASTY Right 2009   Patient Active Problem List   Diagnosis Date Noted   Chronic mesenteric ischemia 07/25/2015   Bacteriuria with pyuria 07/03/2015   Left flank pain, chronic 06/30/2015   Essential hypertension 05/12/2015   Morbid obesity (HCC) 03/03/2015   Hip pain 03/03/2015   GERD (gastroesophageal reflux disease) 03/03/2015   Hx of sleep apnea 03/03/2015    ONSET DATE: 07/27/2024  REFERRING DIAG:  chronic headaches  THERAPY DIAG:  Chronic tension-type headache, intractable  Muscle weakness (generalized)  Limited active range of motion (AROM) of cervical spine on lateral flexion  Rationale for Evaluation and Treatment: Rehabilitation  SUBJECTIVE:                                                                                                                                                                                             SUBJECTIVE STATEMENT:  FROM Today: Patient reports she believes the  medication is helping some. Rates frontal headaches at a 6/10   FROM EVAL: Patient reports her MD sent her to PT in the hopes of helping her decrease the frequency and intensity of her tension headaches. Patient reports dealing with frontal and temporal headache symptoms for past 3 weeks. States the MD does not think it is sinus related and would like to try PT and may include dry needling as mentioned by her MD.  Pt accompanied by: self  PERTINENT HISTORY:   PER MD Note: Patient presents for urgent follow-up after recent ED visit for worsening headaches. Patient has been experiencing daily headaches for the last 2 weeks which she describes as throbbing pressure in frontal and bitemporal regions. Denies associated photophobia, phonophobia. She has been evaluated by ENT reports workup was reassuring, without noticeable sinus issues. See above section for PMH   PAIN:  Are you having pain? Yes: NPRS scale: No pain currently- intermittent headaches - Frontal and temporal- worst= 10/10 and best = 0/10.  Pain location: frontal headaches and temporal headaches Pain description: throbbing, pressure, dull (no recent dizziness in past 2 weeks)  Aggravating factors: Too much light, sitting at computer, high stress and anxiety.  Relieving factors: Medication and rest- laying down.   PRECAUTIONS: None  RED FLAGS: None   WEIGHT BEARING RESTRICTIONS: No  FALLS: Has patient fallen in last 6  months? 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   OBJECTIVE:  Note: Objective measures were completed at Evaluation unless otherwise noted.  DIAGNOSTIC FINDINGS: 08/08/2024 CT brain without contrast IMPRESSION:  No acute intracranial abnormalities.   COGNITION: Overall cognitive status: Within functional limits for tasks assessed   SENSATION: WFL  COORDINATION: WNL  EDEMA:  None  POSTURE: rounded shoulders and forward head  Palpation +tenderness along B Upper trap with multiple taut bands noted   Strength R/L 4/4 Shoulder flexion (anterior deltoid/pec major/coracobrachialis, axillary n. (C5/6) and musculocutaneous n. (C5-7)) 4/4 Shoulder abduction (deltoid/supraspinatus, axillary/suprascapular n, C5) 5/5 Shoulder external rotation (infraspinatus/teres minor) 5/5 Shoulder internal rotation (subcapularis/lats/pec major) 5/5 Shoulder extension (posterior deltoid, lats, teres major, axillary/thoracodorsal n.) 5/5 Shoulder horizontal abduction 5/5 Elbow flexion (biceps brachii, brachialis, brachioradialis, musculoskeletal n, C5/6) 5/5 Elbow extension (triceps, radial n, C7)   AROM R/L 43 Cervical Flexion 78 Cervical Extension *25/*28 Cervical Lateral Flexion 78/80 Cervical Rotation *Indicates pain, overpressure performed unless otherwise indicated  BED MOBILITY:  Not tested  TRANSFERS: INDEPENDENT  RAMP:  Not tested  CURB:  Not tested  STAIRS: Not tested GAIT: Independent  FUNCTIONAL TESTS:    PATIENT SURVEYS:  none  TREATMENT DATE: 08/28/2024   Manual Therapy:  -Lumbrical grip  sub-occipital release - hold 60 sec x 5 -Manual cervical traction (supine) -hold 45 sec x 5 -PA cervical mobs (grade II-III) C3-7 x 30 bouts ea section -Cervical SB with side glide- C4-7 on R  Therex: PROM to c-spine- Side bending with gentle inf pressure to each Shoulder x several min   Trigger Point Dry Needling  Initial Treatment: Pt instructed on Dry Needling rational, procedures, and possible side effects. Pt instructed to expect mild to moderate muscle soreness later in the day and/or into the next day.  Pt instructed in methods to reduce muscle soreness. Pt instructed to continue prescribed HEP. Because Dry Needling was performed over or adjacent to a lung field, pt was educated on S/S of pneumothorax and to seek immediate medical attention should they occur.  Patient was educated on signs and symptoms of infection and other risk factors and advised to seek medical attention should they occur.  Patient verbalized understanding of these instructions and education.   Patient Verbal Consent Given: Yes Education Handout Provided: Yes Muscles Treated: Bilateral Suboccipitals and R cervical paraspinal- multifidus Electrical Stimulation Performed: No Treatment Response/Outcome: 1 local twitch response to R multifidus cervical region- No advserse immediate reaction to any needling. Patient reported feeling immediate relief from head pain- Did not rate but did report less pain.    PATIENT EDUCATION: Education details: Purpose of PT; plan of care; Modalities and pain management education, Purpose and indication of DN, importance of posture to decrease tension for possible improvement in headache relief  Person educated: Patient Education method: Explanation, Demonstration, Tactile cues, Verbal cues, and Handouts Education comprehension: verbalized understanding, returned demonstration, verbal cues required, tactile cues required, and needs further education  HOME EXERCISE PROGRAM: To  be initiated next visit  GOALS: Goals reviewed with patient? Yes  SHORT TERM GOALS: Target date: 11/16/2024  Pt will be independent with HEP in order to improve strength and decrease back pain in order to improve pain-free function at home and work. Baseline: EVAL - No formal HEP In place Goal status: INITIAL   LONG TERM GOALS: Target date: 11/16/2022  Patient will demonstrate improved sitting and standing posture in clinic 90% of time for overall assist with less tension in neck and upper back Baseline: EVAL- Poor postural awareness with excessive forward head and rounded shoulders Goal status: INITIAL  2.  Patient will report decreased risk of headaches by at 25% for improved function and quality of life and home and work.  Baseline: EVAL: Patient reports daily headaches Goal status: INITIAL  3.  Patient will demonstrate improved cervical lateral flexion by 10 deg AROM for improved head motion at home and work. Baseline: EVAL= R/L=*25/*28  Goal status: INITIAL  4. Pt will report decreased head pain as reported on NPRS by at least 2 points at worst in order to demonstrate clinically significant reduction in back pain.  Baseline: EVAL= 10/10 at worst Goal status: INITIAL   ASSESSMENT:  CLINICAL IMPRESSION: Patient is a 73 y.o. female who was seen today for physical therapy  treatment for chronic tension headahes. Pt presents some cervical stiffness (right more than left). She presents with some relief with maual therapy and denied any cervical pain or worsening pain. Patient reported feeling some immediate relief with initial dry needling to suboccipitals and cervical paraspinals as well today. Pt will benefit from skilled PT services to address deficits and return to pain-free function at home and work.   OBJECTIVE  IMPAIRMENTS: decreased activity tolerance, decreased mobility, decreased ROM, decreased strength, hypomobility, increased fascial restrictions, impaired flexibility,  impaired UE functional use, postural dysfunction, and pain.   ACTIVITY LIMITATIONS: carrying and lifting  PARTICIPATION LIMITATIONS: meal prep, cleaning, laundry, shopping, community activity, occupation, yard work, and church  PERSONAL FACTORS: 1-2 comorbidities: Anxiety and OA are also affecting patient's functional outcome.   REHAB POTENTIAL: Good  CLINICAL DECISION MAKING: Stable/uncomplicated  EVALUATION COMPLEXITY: Low  PLAN:  PT FREQUENCY: 1-2x/week  PT DURATION: 12 weeks  PLANNED INTERVENTIONS: 97164- PT Re-evaluation, 97750- Physical Performance Testing, 97110-Therapeutic exercises, 97530- Therapeutic activity, [id number]- Neuromuscular re-education, 97535- Self Care, 02859- Manual therapy, [phone]- Canalith repositioning, [id number]- Electrical stimulation (manual), [id number]- Traction (mechanical), 20560 (1-2 muscles), 20561 (3+ muscles)- Dry Needling, Patient/Family education, Joint mobilization, Joint manipulation, Spinal manipulation, Spinal mobilization, Vestibular training, Cryotherapy, and Moist heat  PLAN FOR NEXT SESSION:  Manual therapy for ROM pain relief Instruct in Cervical stretching- add to HEP Instruct in posture and body  mechanics Modalities as needed for pain management Dry needling for pain/hypomobility/trigger points   Reyes LOISE London, PT 08/28/2024, 11:05 AM

## 2024-08-28 ENCOUNTER — Ambulatory Visit: Admitting: Physical Therapy

## 2024-08-28 ENCOUNTER — Ambulatory Visit: Attending: Neurology

## 2024-08-28 DIAGNOSIS — G44221 Chronic tension-type headache, intractable: Secondary | ICD-10-CM | POA: Insufficient documentation

## 2024-08-28 DIAGNOSIS — M6281 Muscle weakness (generalized): Secondary | ICD-10-CM | POA: Insufficient documentation

## 2024-08-28 DIAGNOSIS — M5382 Other specified dorsopathies, cervical region: Secondary | ICD-10-CM | POA: Diagnosis present

## 2024-09-04 ENCOUNTER — Ambulatory Visit: Admitting: Physical Therapy

## 2024-09-04 DIAGNOSIS — G44221 Chronic tension-type headache, intractable: Secondary | ICD-10-CM | POA: Diagnosis not present

## 2024-09-04 DIAGNOSIS — M5382 Other specified dorsopathies, cervical region: Secondary | ICD-10-CM

## 2024-09-04 DIAGNOSIS — M6281 Muscle weakness (generalized): Secondary | ICD-10-CM

## 2024-09-04 NOTE — Therapy (Signed)
 OUTPATIENT PHYSICAL THERAPY NEURO TREATMENT   Patient Name: Cindy Anthony MRN: [id number] DOB:07/09/1951, 73 y.o., female Today's Date: 09/04/2024   PCP: Dr. Vernell Lied REFERRING PROVIDER: Dr. Arthea Farrow  END OF SESSION:  PT End of Session - 09/04/24 1106     Visit Number 3    Number of Visits 24    Date for Recertification  11/16/24    Progress Note Due on Visit 10    PT Start Time 1020    PT Stop Time 1105    PT Time Calculation (min) 45 min    Activity Tolerance Patient tolerated treatment well    Behavior During Therapy WFL for tasks assessed/performed            Past Medical History:  Diagnosis Date   Anxiety    Arthritis    OSTEOARTHRITIS   Benign paroxysmal positional vertigo    Depression    GERD (gastroesophageal reflux disease)    Hemorrhoids    History of chicken pox    Hyperlipidemia    Hypertension    Piriformis syndrome of both sides    Sleep apnea    Past Surgical History:  Procedure Laterality Date   ABDOMINAL HYSTERECTOMY     BARIATRIC SURGERY  2013   sleeve   COLONOSCOPY WITH PROPOFOL  N/A 11/28/2018   Procedure: COLONOSCOPY WITH PROPOFOL ;  Surgeon: Viktoria Lamar DASEN, MD;  Location: [facility] ENDOSCOPY;  Service: Endoscopy;  Laterality: N/A;   ESOPHAGOGASTRODUODENOSCOPY (EGD) WITH PROPOFOL  N/A 11/28/2018   Procedure: ESOPHAGOGASTRODUODENOSCOPY (EGD) WITH PROPOFOL ;  Surgeon: Viktoria Lamar DASEN, MD;  Location: [facility] ENDOSCOPY;  Service: Endoscopy;  Laterality: N/A;   JOINT REPLACEMENT     SHOULDER ARTHROSCOPY Right 2007   TOTAL KNEE ARTHROPLASTY Right 2009   Patient Active Problem List   Diagnosis Date Noted   Chronic mesenteric ischemia 07/25/2015   Bacteriuria with pyuria 07/03/2015   Left flank pain, chronic 06/30/2015   Essential hypertension 05/12/2015   Morbid obesity (HCC) 03/03/2015   Hip pain 03/03/2015   GERD (gastroesophageal reflux disease) 03/03/2015   Hx of sleep apnea 03/03/2015    ONSET DATE: 07/27/2024  REFERRING  DIAG: chronic headaches  THERAPY DIAG:  Chronic tension-type headache, intractable  Muscle weakness (generalized)  Limited active range of motion (AROM) of cervical spine on lateral flexion  Rationale for Evaluation and Treatment: Rehabilitation  SUBJECTIVE:                                                                                                                                                                                             SUBJECTIVE STATEMENT:  FROM Today:  Pt reports that  she is not bad today. With a light HA.   Reports that her HA was less frequent and less severe since TDN performed in last PT treatment.   FROM EVAL: Patient reports her MD sent her to PT in the hopes of helping her decrease the frequency and intensity of her tension headaches. Patient reports dealing with frontal and temporal headache symptoms for past 3 weeks. States the MD does not think it is sinus related and would like to try PT and may include dry needling as mentioned by her MD.  Pt accompanied by: self  PERTINENT HISTORY:   PER MD Note: Patient presents for urgent follow-up after recent ED visit for worsening headaches. Patient has been experiencing daily headaches for the last 2 weeks which she describes as throbbing pressure in frontal and bitemporal regions. Denies associated photophobia, phonophobia. She has been evaluated by ENT reports workup was reassuring, without noticeable sinus issues. See above section for PMH   PAIN:  Are you having pain? Yes: NPRS scale: No pain currently- intermittent headaches - Frontal and temporal- worst= 10/10 and best = 0/10.  Pain location: frontal headaches and temporal headaches Pain description: throbbing, pressure, dull (no recent dizziness in past 2 weeks)  Aggravating factors: Too much light, sitting at computer, high stress and anxiety.  Relieving factors: Medication and rest- laying down.   PRECAUTIONS: None  RED  FLAGS: None   WEIGHT BEARING RESTRICTIONS: No  FALLS: Has patient fallen in last 6 months? 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   OBJECTIVE:  Note: Objective measures were completed at Evaluation unless otherwise noted.  DIAGNOSTIC FINDINGS: 08/08/2024 CT brain without contrast IMPRESSION:  No acute intracranial abnormalities.   COGNITION: Overall cognitive status: Within functional limits for tasks assessed   SENSATION: WFL  COORDINATION: WNL  EDEMA:  None  POSTURE: rounded shoulders and forward head  Palpation +tenderness along B Upper trap with multiple taut bands noted   Strength R/L 4/4 Shoulder flexion (anterior deltoid/pec major/coracobrachialis, axillary n. (C5/6) and musculocutaneous n. (C5-7)) 4/4 Shoulder abduction (deltoid/supraspinatus, axillary/suprascapular n, C5) 5/5 Shoulder external rotation (infraspinatus/teres minor) 5/5 Shoulder internal rotation (subcapularis/lats/pec major) 5/5 Shoulder extension (posterior deltoid, lats, teres major, axillary/thoracodorsal n.) 5/5 Shoulder horizontal abduction 5/5 Elbow flexion (biceps brachii, brachialis, brachioradialis, musculoskeletal n, C5/6) 5/5 Elbow extension (triceps, radial n, C7)   AROM R/L 43 Cervical Flexion 78 Cervical Extension *25/*28 Cervical Lateral Flexion 78/80 Cervical Rotation *Indicates pain, overpressure performed unless otherwise indicated  BED MOBILITY:  Not tested  TRANSFERS: INDEPENDENT  RAMP:  Not tested  CURB:  Not tested  STAIRS: Not tested GAIT: Independent  FUNCTIONAL TESTS:    PATIENT SURVEYS:  none  TREATMENT DATE: 08/28/2024   Seated therex for HEP:  Shoulder ER with scapular retraction 2x12  Lateral flexion/UT stretch with hand locked on chair 2 x 20 sec bil  Shoulder rolls 2 x 10   Levator stretch 2 x 20 sec bil  Supine chin tucks x 10 with 3 sec hold   Manual Therapy:  -Lumbrical grip sub-occipital release - hold 60 sec x 2 -Manual cervical traction (supine) -hold 45 sec x 2 -UPA cervical mobs (grade II-III) C3-7 x 30 bouts ea section -STM to UT and cervical paraspinals x 5 min each    Trigger Point Dry Needling  Initial Treatment: Pt instructed on Dry Needling rational, procedures, and possible side effects. Pt instructed to expect mild to moderate muscle soreness later in the day and/or into the next day.  Pt instructed in methods to reduce muscle soreness. Pt instructed to continue prescribed HEP. Because Dry Needling was performed over or adjacent to a lung field, pt was educated on S/S of pneumothorax and to seek immediate medical attention should they occur.  Patient was educated on signs and symptoms of infection and other risk factors and advised to seek medical attention should they occur.  Patient verbalized understanding of these instructions and education.  and Subsequent Treatment: Instructions reviewed, if requested by the patient, prior to subsequent dry needling treatment.   Patient Verbal Consent Given: Yes Education Handout Provided: Yes Muscles Treated: Bilateral Suboccipitals and Bil cervical paraspinal- multifidus Electrical Stimulation Performed: No Treatment Response/Outcome: 1 local twitch response to R multifidus cervical region on the L- No advserse immediate reaction to any needling. Patient reported feeling immediate relief from head pain- Did not rate but did report less pain.    PATIENT EDUCATION: Education details: Purpose of PT; plan of care; Modalities and pain management education, Purpose and indication of DN, importance of  posture to decrease tension for possible improvement in headache relief  Person educated: Patient Education method: Explanation, Demonstration, Tactile cues, Verbal cues, and Handouts Education comprehension: verbalized understanding, returned demonstration, verbal cues required, tactile cues required, and needs further education  HOME EXERCISE PROGRAM: To be initiated next visit  GOALS: Goals reviewed with patient? Yes  SHORT TERM GOALS: Target date: 11/16/2024  Pt will be independent with HEP in order to improve strength and decrease back pain in order to improve pain-free function at home and work. Baseline: EVAL - No formal HEP In place Goal status: INITIAL   LONG TERM GOALS: Target date: 11/16/2022  Patient will demonstrate improved sitting and standing posture in clinic 90% of time for overall assist with less tension in neck and upper back Baseline: EVAL- Poor postural awareness with excessive forward head and rounded shoulders Goal status: INITIAL  2.  Patient will report decreased risk of headaches by at 25% for improved function and quality of life and home and work.  Baseline: EVAL: Patient reports daily headaches Goal status: INITIAL  3.  Patient will demonstrate improved cervical lateral flexion by 10 deg AROM for improved head motion at home and work. Baseline: EVAL= R/L=*25/*28  Goal status: INITIAL  4. Pt will report decreased head pain as reported on NPRS by at least 2 points at worst in order to demonstrate clinically significant reduction in back pain.  Baseline: EVAL= 10/10 at worst Goal status: INITIAL   ASSESSMENT:  CLINICAL IMPRESSION: Patient is a 73 y.o. female who was seen today for physical therapy  treatment for chronic tension headahes. Pt presents some cervical stiffness but more symmetrical  on this day. Tolerated therex well today for HEP, but hand out was not provided. Will need to provide at next session. Patient reported feeling some  immediate relief with continued dry needling to suboccipitals and cervical paraspinals as well today. Pt will benefit from skilled PT services to address deficits and return to pain-free function at home and work.   OBJECTIVE IMPAIRMENTS: decreased activity tolerance, decreased mobility, decreased ROM, decreased strength, hypomobility, increased fascial restrictions, impaired flexibility, impaired UE functional use, postural dysfunction, and pain.   ACTIVITY LIMITATIONS: carrying and lifting  PARTICIPATION LIMITATIONS: meal prep, cleaning, laundry, shopping, community activity, occupation, yard work, and church  PERSONAL FACTORS: 1-2 comorbidities: Anxiety and OA are also affecting patient's functional outcome.   REHAB POTENTIAL: Good  CLINICAL DECISION MAKING: Stable/uncomplicated  EVALUATION COMPLEXITY: Low  PLAN:  PT FREQUENCY: 1-2x/week  PT DURATION: 12 weeks  PLANNED INTERVENTIONS: 97164- PT Re-evaluation, 97750- Physical Performance Testing, 97110-Therapeutic exercises, 97530- Therapeutic activity, [id number]- Neuromuscular re-education, 97535- Self Care, 02859- Manual therapy, [id number]- Canalith repositioning, [id number]- Electrical stimulation (manual), [id number]- Traction (mechanical), 20560 (1-2 muscles), 20561 (3+ muscles)- Dry Needling, Patient/Family education, Joint mobilization, Joint manipulation, Spinal manipulation, Spinal mobilization, Vestibular training, Cryotherapy, and Moist heat  PLAN FOR NEXT SESSION:   Provide hand out for HEP that was instructed on 09/04/24 Manual therapy for ROM pain relief Continue Instruct in Cervical stretching- add to HEP Instruct in posture and body  mechanics Modalities as needed for pain management Dry needling for pain/hypomobility/trigger points in cspine and UT as appropriate.    Massie FORBES Dollar, PT 09/04/2024, 11:08 AM

## 2024-09-11 ENCOUNTER — Ambulatory Visit

## 2024-09-11 DIAGNOSIS — G44221 Chronic tension-type headache, intractable: Secondary | ICD-10-CM | POA: Diagnosis not present

## 2024-09-11 DIAGNOSIS — M6281 Muscle weakness (generalized): Secondary | ICD-10-CM

## 2024-09-11 DIAGNOSIS — M5382 Other specified dorsopathies, cervical region: Secondary | ICD-10-CM

## 2024-09-11 NOTE — Therapy (Signed)
 OUTPATIENT PHYSICAL THERAPY NEURO TREATMENT   Patient Name: Cindy Anthony MRN: [id number] DOB:[birth year], 73 y.o., female Today's Date: 09/11/2024   PCP: Dr. Vernell Lied REFERRING PROVIDER: Dr. Arthea Farrow  END OF SESSION:  PT End of Session - 09/11/24 0756     Visit Number 4    Number of Visits 24    Date for Recertification  11/16/24    Progress Note Due on Visit 10    PT Start Time 0800    PT Stop Time 0844    PT Time Calculation (min) 44 min    Activity Tolerance Patient tolerated treatment well    Behavior During Therapy [facility] for tasks assessed/performed            Past Medical History:  Diagnosis Date   Anxiety    Arthritis    OSTEOARTHRITIS   Benign paroxysmal positional vertigo    Depression    GERD (gastroesophageal reflux disease)    Hemorrhoids    History of chicken pox    Hyperlipidemia    Hypertension    Piriformis syndrome of both sides    Sleep apnea    Past Surgical History:  Procedure Laterality Date   ABDOMINAL HYSTERECTOMY     BARIATRIC SURGERY  2013   sleeve   COLONOSCOPY WITH PROPOFOL  N/A 11/28/2018   Procedure: COLONOSCOPY WITH PROPOFOL ;  Surgeon: Viktoria Lamar DASEN, MD;  Location: [facility] ENDOSCOPY;  Service: Endoscopy;  Laterality: N/A;   ESOPHAGOGASTRODUODENOSCOPY (EGD) WITH PROPOFOL  N/A 11/28/2018   Procedure: ESOPHAGOGASTRODUODENOSCOPY (EGD) WITH PROPOFOL ;  Surgeon: Viktoria Lamar DASEN, MD;  Location: [facility] ENDOSCOPY;  Service: Endoscopy;  Laterality: N/A;   JOINT REPLACEMENT     SHOULDER ARTHROSCOPY Right 2007   TOTAL KNEE ARTHROPLASTY Right 2009   Patient Active Problem List   Diagnosis Date Noted   Chronic mesenteric ischemia 07/25/2015   Bacteriuria with pyuria 07/03/2015   Left flank pain, chronic 06/30/2015   Essential hypertension 05/12/2015   Morbid obesity (HCC) 03/03/2015   Hip pain 03/03/2015   GERD (gastroesophageal reflux disease) 03/03/2015   Hx of sleep apnea 03/03/2015    ONSET DATE: 07/27/2024  REFERRING  DIAG: chronic headaches  THERAPY DIAG:  Chronic tension-type headache, intractable  Muscle weakness (generalized)  Limited active range of motion (AROM) of cervical spine on lateral flexion  Rationale for Evaluation and Treatment: Rehabilitation  SUBJECTIVE:                                                                                                                                                                                             SUBJECTIVE STATEMENT:  FROM Today:  Pt reports that  she was sore overall after last session but states she feels like it is helping. States 5/10 frontal Headache today.       FROM EVAL: Patient reports her MD sent her to PT in the hopes of helping her decrease the frequency and intensity of her tension headaches. Patient reports dealing with frontal and temporal headache symptoms for past 3 weeks. States the MD does not think it is sinus related and would like to try PT and may include dry needling as mentioned by her MD.  Pt accompanied by: self  PERTINENT HISTORY:   PER MD Note: Patient presents for urgent follow-up after recent ED visit for worsening headaches. Patient has been experiencing daily headaches for the last 2 weeks which she describes as throbbing pressure in frontal and bitemporal regions. Denies associated photophobia, phonophobia. She has been evaluated by ENT reports workup was reassuring, without noticeable sinus issues. See above section for PMH   PAIN:  Are you having pain? Yes: NPRS scale: No pain currently- intermittent headaches - Frontal and temporal- worst= 10/10 and best = 0/10.  Pain location: frontal headaches and temporal headaches Pain description: throbbing, pressure, dull (no recent dizziness in past 2 weeks)  Aggravating factors: Too much light, sitting at computer, high stress and anxiety.  Relieving factors: Medication and rest- laying down.   PRECAUTIONS: None  RED FLAGS: None   WEIGHT BEARING  RESTRICTIONS: No  FALLS: Has patient fallen in last 6 months? 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   OBJECTIVE:  Note: Objective measures were completed at Evaluation unless otherwise noted.  DIAGNOSTIC FINDINGS: 08/08/2024 CT brain without contrast IMPRESSION:  No acute intracranial abnormalities.   COGNITION: Overall cognitive status: Within functional limits for tasks assessed   SENSATION: WFL  COORDINATION: WNL  EDEMA:  None  POSTURE: rounded shoulders and forward head  Palpation +tenderness along B Upper trap with multiple taut bands noted   Strength R/L 4/4 Shoulder flexion (anterior deltoid/pec major/coracobrachialis, axillary n. (C5/6) and musculocutaneous n. (C5-7)) 4/4 Shoulder abduction (deltoid/supraspinatus, axillary/suprascapular n, C5) 5/5 Shoulder external rotation (infraspinatus/teres minor) 5/5 Shoulder internal rotation (subcapularis/lats/pec major) 5/5 Shoulder extension (posterior deltoid, lats, teres major, axillary/thoracodorsal n.) 5/5 Shoulder horizontal abduction 5/5 Elbow flexion (biceps brachii, brachialis, brachioradialis, musculoskeletal n, C5/6) 5/5 Elbow extension (triceps, radial n, C7)   AROM R/L 43 Cervical Flexion 78 Cervical Extension *25/*28 Cervical Lateral Flexion 78/80 Cervical Rotation *Indicates pain, overpressure performed unless otherwise indicated  BED MOBILITY:  Not tested  TRANSFERS: INDEPENDENT  RAMP:  Not tested  CURB:  Not tested  STAIRS: Not tested GAIT: Independent  FUNCTIONAL TESTS:    PATIENT SURVEYS:  none  TREATMENT DATE:  09/11/2024   NMR:  Supine chin tucks x 10 with 5 sec hold Standing Shoulder ER with scapular retraction RTB 2x12  Shoulder rolls 2 x 10   Standing scap retract RTB 2 x 10  Standing wall posture stretch x 60 sec Seated chin retraction - hold 3 sec x 10  Standing chin retraction - hold 3 sec x 10 Supine deep neck flex (chin tuck +head lift off) x 10 reps  Manual Therapy:  -Lumbrical grip sub-occipital release - hold 60 sec x 2 -Manual cervical traction (supine) -hold 45 sec x 2 -UPA cervical mobs (grade II-III) C3-7 x 30 bouts ea section -Manual stretching B UT    Trigger Point Dry Needling  Subsequent Treatment: Instructions provided previously at initial dry needling treatment.   Patient Verbal Consent Given: Yes Education Handout Provided: Previously Provided Muscles Treated: Bilateral Suboccipital region Electrical Stimulation Performed: No Treatment Response/Outcome: 2 twitch responses noted today and patient demonstrated no adverse reaction.       PATIENT EDUCATION: Education details: Purpose of PT; plan of care; Modalities and pain management education, Purpose and indication of DN, importance of posture to decrease tension for possible improvement in headache relief  Person educated: Patient Education method: Explanation, Demonstration, Tactile cues, Verbal cues, and Handouts Education comprehension: verbalized understanding, returned demonstration, verbal cues required, tactile cues required, and needs further education  HOME EXERCISE PROGRAM: Access Code: [email or web address] URL: [email or web address] Date: 09/11/2024 Prepared by: Reyes London  Exercises - Shoulder External Rotation and Scapular Retraction with Resistance  - 3 x weekly - 3 sets - 10 reps - Seated Cervical Retraction  - 3 x weekly - 3 sets - 10 reps - Supine Chin Tuck  - 3 x weekly - 3 sets - 10 reps - Cervical Retraction at Wall  - 3 x weekly - 3 sets - 10 reps - Supine Deep Neck  Flexor Training - Repetitions  - 3 x weekly - 3 sets - 10 reps - Supine Scapular Retraction  - 3 x weekly - 3 sets - 10 reps - Scapular Retraction with Resistance  - 3 x weekly - 3 sets - 10 reps  GOALS: Goals reviewed with patient? Yes  SHORT TERM GOALS: Target date: 11/16/2024  Pt will be independent with HEP in order to improve strength and decrease back pain in order to improve pain-free function at home and work. Baseline: EVAL - No formal HEP In place Goal status: INITIAL   LONG TERM GOALS: Target date: 11/16/2022  Patient will demonstrate improved sitting and standing posture in clinic 90% of time for overall assist with less tension in neck and upper back Baseline: EVAL- Poor postural awareness with excessive forward head and rounded shoulders Goal status: INITIAL  2.  Patient will report decreased risk of headaches by at 25% for improved function and quality of life and home and work.  Baseline: EVAL: Patient reports daily headaches Goal status: INITIAL  3.  Patient will demonstrate improved cervical lateral flexion by 10 deg AROM for improved head motion at home and work. Baseline: EVAL= R/L=*25/*28  Goal status: INITIAL  4. Pt will report decreased head pain as reported on NPRS by at least 2 points at worst in order to demonstrate clinically significant reduction in back pain.  Baseline: EVAL= 10/10 at worst Goal status: INITIAL   ASSESSMENT:  CLINICAL IMPRESSION: Patient is a 73 y.o. female who was seen today for physical therapy  treatment for chronic tension headahes. Patient performed well overall  and able to progress postural awareness activities today. Responded well again to dry needling with 2 twitch responses. She was instructed in HEP today and will benefit from review. Pt will benefit from skilled PT services to address deficits and return to pain-free function at home and work.   OBJECTIVE IMPAIRMENTS: decreased activity tolerance, decreased mobility,  decreased ROM, decreased strength, hypomobility, increased fascial restrictions, impaired flexibility, impaired UE functional use, postural dysfunction, and pain.   ACTIVITY LIMITATIONS: carrying and lifting  PARTICIPATION LIMITATIONS: meal prep, cleaning, laundry, shopping, community activity, occupation, yard work, and church  PERSONAL FACTORS: 1-2 comorbidities: Anxiety and OA are also affecting patient's functional outcome.   REHAB POTENTIAL: Good  CLINICAL DECISION MAKING: Stable/uncomplicated  EVALUATION COMPLEXITY: Low  PLAN:  PT FREQUENCY: 1-2x/week  PT DURATION: 12 weeks  PLANNED INTERVENTIONS: 97164- PT Re-evaluation, 97750- Physical Performance Testing, 97110-Therapeutic exercises, 97530- Therapeutic activity, [id number]- Neuromuscular re-education, 97535- Self Care, 02859- Manual therapy, [phone]- Canalith repositioning, [id number]- Electrical stimulation (manual), [id number]- Traction (mechanical), 20560 (1-2 muscles), 20561 (3+ muscles)- Dry Needling, Patient/Family education, Joint mobilization, Joint manipulation, Spinal manipulation, Spinal mobilization, Vestibular training, Cryotherapy, and Moist heat  PLAN FOR NEXT SESSION:   Provide hand out for HEP that was instructed on 09/04/24 Manual therapy for ROM pain relief Continue Instruct in Cervical stretching- add to HEP Instruct in posture and body  mechanics Modalities as needed for pain management Dry needling for pain/hypomobility/trigger points in cspine and UT as appropriate.    Reyes LOISE London, PT 09/11/2024, 10:05 AM

## 2024-09-15 ENCOUNTER — Other Ambulatory Visit: Payer: Self-pay

## 2024-09-15 ENCOUNTER — Ambulatory Visit (HOSPITAL_BASED_OUTPATIENT_CLINIC_OR_DEPARTMENT_OTHER): Admitting: Student

## 2024-09-15 ENCOUNTER — Emergency Department

## 2024-09-15 ENCOUNTER — Emergency Department
Admission: EM | Admit: 2024-09-15 | Discharge: 2024-09-15 | Disposition: A | Discharge status: Home / Self Care | Attending: Emergency Medicine | Admitting: Emergency Medicine

## 2024-09-15 ENCOUNTER — Encounter: Payer: Self-pay | Admitting: Emergency Medicine

## 2024-09-15 DIAGNOSIS — M549 Dorsalgia, unspecified: Secondary | ICD-10-CM | POA: Diagnosis present

## 2024-09-15 DIAGNOSIS — I10 Essential (primary) hypertension: Secondary | ICD-10-CM | POA: Insufficient documentation

## 2024-09-15 DIAGNOSIS — M545 Low back pain, unspecified: Secondary | ICD-10-CM | POA: Insufficient documentation

## 2024-09-15 LAB — URINALYSIS, ROUTINE W REFLEX MICROSCOPIC
Bilirubin Urine: NEGATIVE
Glucose, UA: NEGATIVE mg/dL
Hgb urine dipstick: NEGATIVE
Ketones, ur: NEGATIVE mg/dL
Leukocytes,Ua: NEGATIVE
Nitrite: NEGATIVE
Protein, ur: NEGATIVE mg/dL
Specific Gravity, Urine: 1.023 (ref 1.005–1.030)
pH: 5 (ref 5.0–8.0)

## 2024-09-15 MED ORDER — LIDOCAINE 5 % EX PTCH
1.0000 | MEDICATED_PATCH | CUTANEOUS | Status: DC
Start: 2024-09-15 — End: 2024-09-15
  Administered 2024-09-15: 1 via TRANSDERMAL
  Filled 2024-09-15: qty 1

## 2024-09-15 MED ORDER — DIAZEPAM 2 MG PO TABS: 2.0000 mg | ORAL_TABLET | Freq: Three times a day (TID) | ORAL | 0 refills | Status: DC | PRN

## 2024-09-15 MED ORDER — KETOROLAC TROMETHAMINE 15 MG/ML IJ SOLN
15.0000 mg | Freq: Once | INTRAMUSCULAR | Status: DC
  Filled 2024-09-15: qty 1

## 2024-09-15 MED ORDER — NAPROXEN 500 MG PO TABS: 500.0000 mg | ORAL_TABLET | Freq: Two times a day (BID) | ORAL | 0 refills | Status: DC

## 2024-09-15 MED ORDER — KETOROLAC TROMETHAMINE 15 MG/ML IJ SOLN
15.0000 mg | Freq: Once | INTRAMUSCULAR | Status: AC
  Administered 2024-09-15: 15 mg via INTRAMUSCULAR

## 2024-09-15 NOTE — Discharge Instructions (Addendum)
 Follow-up with your primary care provider or keep your appointment with [facility] to follow-up with your back pain and muscle spasms.  A prescription for naproxen  and diazepam was sent to the pharmacy.  Be aware that the diazepam is for muscle spasms and could cause drowsiness and increase your risk for falling.  Do not take this medication if you plan on driving.  The naproxen  should be taken with food and should help with muscle pain.  You may also take Tylenol  with this medication if additional pain medication is needed.  Use ice or heat to your back as needed for discomfort.

## 2024-09-15 NOTE — ED Provider Notes (Signed)
 [facility] Provider Note    Event Date/Time   First MD Initiated Contact with Patient 09/15/24 [phone]     (approximate)   History   Back Pain   HPI  Cindy Anthony is a 73 y.o. female presents to the ED from home stating that she has had back pain since yesterday morning when she woke up.  She denies any injury.  Patient reports that she has a history of urinary tract infections and recently had a UTI.  Patient has not taken any over-the-counter medications for her back pain.  She was able to drive to the emergency department from home.  Patient also has a history of sleep apnea, hypertension, chronic left flank pain, bacteria, GERD.     Physical Exam   Triage Vital Signs: ED Triage Vitals  Encounter Vitals Group     BP 09/15/24 0745 133/60     Girls Systolic BP Percentile --      Girls Diastolic BP Percentile --      Boys Systolic BP Percentile --      Boys Diastolic BP Percentile --      Pulse Rate 09/15/24 0745 68     Resp 09/15/24 0745 17     Temp 09/15/24 0745 97.6 F (36.4 C)     Temp Source 09/15/24 0745 Oral     SpO2 09/15/24 0745 100 %     Weight 09/15/24 0744 265 lb (120.2 kg)     Height 09/15/24 0744 5' 3 (1.6 m)     Head Circumference --      Peak Flow --      Pain Score 09/15/24 0744 10     Pain Loc --      Pain Education --      Exclude from Growth Chart --     Most recent vital signs: Vitals:   09/15/24 0745  BP: 133/60  Pulse: 68  Resp: 17  Temp: 97.6 F (36.4 C)  SpO2: 100%     General: Awake, no distress.  CV:  Good peripheral perfusion.  Heart regular rate rhythm. Resp:  Normal effort.  Lungs are clear bilaterally.  No CVA tenderness. Abd:  No distention.  Other:  No point tenderness on palpation of the thoracic or lumbar spine on palpation.  No step-offs are appreciated.  There is tenderness on palpation of the lumbar paravertebral muscles bilaterally with range of motion restricted secondary to muscle spasms  and increased pain.  Patient was able to stand and ambulate with minimal assistance since to get a urine specimen while in the ED.   ED Results / Procedures / Treatments   Labs (all labs ordered are listed, but only abnormal results are displayed) Labs Reviewed  URINALYSIS, ROUTINE W REFLEX MICROSCOPIC - Abnormal; Notable for the following components:      Result Value   Color, Urine YELLOW (*)    APPearance CLEAR (*)    All other components within normal limits     RADIOLOGY Lumbar spine x-ray images were reviewed and interpreted by myself independent of the radiologist and no compression fracture or subluxation was noted.  Official radiology report shows mild spondylosis of the lumbar spine with mild disc disease at L3-L4.  Grade 1 anterolithiasis of L4-L5   PROCEDURES:  Critical Care performed:   Procedures   MEDICATIONS ORDERED IN ED: Medications  lidocaine  (LIDODERM ) 5 % 1 patch (1 patch Transdermal Patch Applied 09/15/24 0913)  ketorolac  (TORADOL ) 15 MG/ML injection 15 mg (  15 mg Intramuscular Given 09/15/24 0911)     IMPRESSION / MDM / ASSESSMENT AND PLAN / ED COURSE  I reviewed the triage vital signs and the nursing notes.   Differential diagnosis includes, but is not limited to, urinary tract infection, urolithiasis, muscle spasms, compression fracture, lumbar spine disc disease, lumbar strain, acute lumbar pain.  73 year old female presents to the ED with complaint of low back pain without sciatica.  Patient denies any history of injury and states that it began when she woke up yesterday morning.  She has not taken any over-the-counter medication.  An injection of Toradol  15 mg IM and a lidocaine  patch was applied to her back.  Patient was made aware that on exam most likely she is having muscle spasms but reassured that her back x-ray was negative for compression fracture.  A prescription for naproxen  and diazepam was sent to the pharmacy for her to begin taking when  she is at home.  The diazepam 2 mg to be taken every 8 hours if needed for muscle spasms and she is aware that this could cause drowsiness and increase her risk for falling.  She has to follow-up with her PCP if any continued problems.  Urinalysis was clear.      Patient's presentation is most consistent with acute complicated illness / injury requiring diagnostic workup.  FINAL CLINICAL IMPRESSION(S) / ED DIAGNOSES   Final diagnoses:  Acute midline low back pain without sciatica     Rx / DC Orders   ED Discharge Orders          Ordered    naproxen  (NAPROSYN ) 500 MG tablet  2 times daily with meals        09/15/24 0854    diazepam (VALIUM) 2 MG tablet  Every 8 hours PRN        09/15/24 0854             Note:  This document was prepared using Dragon voice recognition software and may include unintentional dictation errors.   Saunders Shona CROME, PA-C 09/15/24 1304    Willo Dunnings, MD 09/15/24 [phone]

## 2024-09-15 NOTE — ED Triage Notes (Signed)
 Pt ambulatory to triage with use of cane; requesting a wheelchair. Arrived via private auto with c/o lower back pain that started yesterday upon waking. Pt denies fall or injury. No hx of the same.

## 2024-09-15 NOTE — ED Notes (Signed)
 See triage note  Presents with lower back pain  States pain is mid back  Non radiating  Denies any urinary sx's but has a hx of UTI's

## 2024-09-18 ENCOUNTER — Ambulatory Visit

## 2024-09-25 ENCOUNTER — Ambulatory Visit: Admitting: Physical Therapy

## 2024-09-25 ENCOUNTER — Ambulatory Visit

## 2024-09-28 ENCOUNTER — Encounter: Payer: Self-pay | Admitting: Radiology

## 2024-10-02 ENCOUNTER — Ambulatory Visit

## 2024-10-09 ENCOUNTER — Ambulatory Visit

## 2024-10-16 ENCOUNTER — Ambulatory Visit (INDEPENDENT_AMBULATORY_CARE_PROVIDER_SITE_OTHER): Admitting: Internal Medicine

## 2024-10-16 ENCOUNTER — Ambulatory Visit: Payer: Self-pay | Admitting: Internal Medicine

## 2024-10-16 ENCOUNTER — Ambulatory Visit

## 2024-10-16 ENCOUNTER — Encounter: Payer: Self-pay | Admitting: Internal Medicine

## 2024-10-16 VITALS — BP 106/68 | HR 70 | Ht 63.0 in | Wt 257.8 lb

## 2024-10-16 DIAGNOSIS — I10 Essential (primary) hypertension: Secondary | ICD-10-CM | POA: Diagnosis not present

## 2024-10-16 DIAGNOSIS — G8929 Other chronic pain: Secondary | ICD-10-CM

## 2024-10-16 DIAGNOSIS — E782 Mixed hyperlipidemia: Secondary | ICD-10-CM | POA: Diagnosis not present

## 2024-10-16 DIAGNOSIS — M791 Myalgia, unspecified site: Secondary | ICD-10-CM | POA: Diagnosis not present

## 2024-10-16 DIAGNOSIS — J301 Allergic rhinitis due to pollen: Secondary | ICD-10-CM | POA: Insufficient documentation

## 2024-10-16 DIAGNOSIS — K219 Gastro-esophageal reflux disease without esophagitis: Secondary | ICD-10-CM

## 2024-10-16 DIAGNOSIS — R7303 Prediabetes: Secondary | ICD-10-CM | POA: Diagnosis not present

## 2024-10-16 DIAGNOSIS — M545 Low back pain, unspecified: Secondary | ICD-10-CM | POA: Insufficient documentation

## 2024-10-16 LAB — POC CREATINE & ALBUMIN,URINE
Albumin/Creatinine Ratio, Urine, POC: <30
Creatinine, POC: 300 mg/dL
Microalbumin Ur, POC: 10 mg/L

## 2024-10-16 LAB — POCT URINALYSIS DIPSTICK
Bilirubin, UA: NEGATIVE
Blood, UA: NEGATIVE
Glucose, UA: NEGATIVE
Ketones, UA: NEGATIVE
Leukocytes, UA: NEGATIVE
Nitrite, UA: NEGATIVE
Protein, UA: NEGATIVE
Spec Grav, UA: 1.025 (ref 1.010–1.025)
Urobilinogen, UA: 0.2 U/dL
pH, UA: 6 (ref 5.0–8.0)

## 2024-10-16 MED ORDER — SEMAGLUTIDE (1 MG/DOSE) 4 MG/3ML ~~LOC~~ SOPN
1.0000 mg | PEN_INJECTOR | SUBCUTANEOUS | 3 refills | Status: AC
Start: 2024-10-16 — End: ?

## 2024-10-16 NOTE — Progress Notes (Signed)
 New Patient Office Visit  Subjective   Patient ID: Cindy Anthony, female    DOB: Jun 04, 1951  Age: 73 y.o. MRN: [id number]  CC:  Chief Complaint  Patient presents with   Establish Care    NPE    HPI Cindy Anthony presents to establish care Previous Primary Care provider/office:   she does not have additional concerns to discuss today.   Patient comes in to establish PMD. Has h/x of HTN, High cholesterol, Prediabetes, tension headaches, GERD and Obesity. Used to have OSA but recent told that she does not have one. Patient had Bariatric surgery(Gastric Sleeve) in 2013,  did not lose much weight. Currently on Ozempic  0.5 mg/week- lost some weight initially but now static, will increase dose to 1 mg/week. Generally feels well, has pain in Right shoulder. No chest pain, no shortness of breath or palpitations. Usually gets Tension headaches , and chronic low back pain, but currently stable. Mammogram 04/2024. Colonoscopy 2020 - negative. BMD -2024 , Normal. PHQ-9/GAD score 5/3. Patient was concerned about recent back muscle spasms are due to Lipitor, but decided to ontinue Lipitor. Will get fasting labs today.    Outpatient Encounter Medications as of 10/16/2024  Medication Sig   atorvastatin (LIPITOR) 10 MG tablet Take 10 mg by mouth daily.   calcium carbonate (OSCAL) 1500 (600 Ca) MG TABS tablet Take by mouth 2 (two) times daily with a meal.   co-enzyme Q-10 30 MG capsule Take 30 mg by mouth daily.   cyanocobalamin (VITAMIN B12) 1000 MCG tablet Take 1,000 mcg by mouth daily.   irbesartan (AVAPRO) 300 MG tablet Take 300 mg by mouth at bedtime.   Multiple Vitamin (MULTIVITAMIN) tablet Take 1 tablet by mouth daily.   psyllium (METAMUCIL) 58.6 % packet Take 1 packet by mouth daily.   Semaglutide , 1 MG/DOSE, 4 MG/3ML SOPN Inject 1 mg into the skin once a week.   spironolactone (ALDACTONE) 50 MG tablet Take 50 mg by mouth daily.   [DISCONTINUED] OZEMPIC , 0.25 OR 0.5 MG/DOSE, 2 MG/3ML  SOPN Inject 0.5 mg into the skin once a week.   [DISCONTINUED] spironolactone (ALDACTONE) 25 MG tablet Take 1 tablet by mouth daily. (Patient taking differently: Take 50 mg by mouth daily.)   [DISCONTINUED] topiramate (TOPAMAX) 25 MG tablet Take 25 mg by mouth 2 (two) times daily.   [DISCONTINUED] benzonatate (TESSALON) 200 MG capsule Take by mouth. (Patient not taking: Reported on 10/16/2024)   [DISCONTINUED] diazepam  (VALIUM ) 2 MG tablet Take 1 tablet (2 mg total) by mouth every 8 (eight) hours as needed for muscle spasms. (Patient not taking: Reported on 10/16/2024)   [DISCONTINUED] docusate sodium  (COLACE) 100 MG capsule Take 100 mg by mouth 2 (two) times daily. (Patient not taking: Reported on 10/16/2024)   [DISCONTINUED] famotidine (PEPCID) 40 MG tablet Take 40 mg by mouth daily. (Patient not taking: Reported on 10/16/2024)   [DISCONTINUED] fenofibrate (TRICOR) 145 MG tablet Take 145 mg by mouth daily. (Patient not taking: Reported on 10/16/2024)   [DISCONTINUED] naproxen  (NAPROSYN ) 500 MG tablet Take 1 tablet (500 mg total) by mouth 2 (two) times daily with a meal. (Patient not taking: Reported on 10/16/2024)   [DISCONTINUED] pantoprazole (PROTONIX) 40 MG tablet Take by mouth. (Patient not taking: Reported on 10/16/2024)   [DISCONTINUED] propranolol (INDERAL) 10 MG tablet Take 1-2 tablets 30-60 min PRN before performance. (Patient not taking: Reported on 10/16/2024)   No facility-administered encounter medications on file as of 10/16/2024.    Past Medical History:  Diagnosis Date  Anxiety    Arthritis    OSTEOARTHRITIS   Benign paroxysmal positional vertigo    Depression    GERD (gastroesophageal reflux disease)    Hemorrhoids    History of chicken pox    Hyperlipidemia    Hypertension    Piriformis syndrome of both sides    Sleep apnea     Past Surgical History:  Procedure Laterality Date   ABDOMINAL HYSTERECTOMY     BARIATRIC SURGERY  2013   sleeve   COLONOSCOPY WITH  PROPOFOL  N/A 11/28/2018   Procedure: COLONOSCOPY WITH PROPOFOL ;  Surgeon: Viktoria Lamar DASEN, MD;  Location: [facility] ENDOSCOPY;  Service: Endoscopy;  Laterality: N/A;   ESOPHAGOGASTRODUODENOSCOPY (EGD) WITH PROPOFOL  N/A 11/28/2018   Procedure: ESOPHAGOGASTRODUODENOSCOPY (EGD) WITH PROPOFOL ;  Surgeon: Viktoria Lamar DASEN, MD;  Location: [facility] ENDOSCOPY;  Service: Endoscopy;  Laterality: N/A;   JOINT REPLACEMENT     SHOULDER ARTHROSCOPY Right 2007   TOTAL KNEE ARTHROPLASTY Right 2009    Family History  Problem Relation Age of Onset   Arthritis Mother    Hyperlipidemia Mother    Heart disease Mother    Stroke Mother    Hypertension Mother    Kidney disease Mother    Diabetes Mother    Colon polyps Mother    Heart attack Mother    Arthritis Father    Heart disease Father    Diabetes Paternal Grandmother    Hyperlipidemia Sister    Hypertension Brother     Social History   Socioeconomic History   Marital status: Divorced    Spouse name: Not on file   Number of children: 3   Years of education: Not on file   Highest education level: Not on file  Occupational History   Occupation: education officer, environmental  Tobacco Use   Smoking status: Never   Smokeless tobacco: Never  Vaping Use   Vaping status: Never Used  Substance and Sexual Activity   Alcohol use: No    Alcohol/week: 0.0 standard drinks of alcohol   Drug use: No   Sexual activity: Not on file  Other Topics Concern   Not on file  Social History Narrative   Not on file   Social Drivers of Health   Financial Resource Strain: Low Risk  (04/18/2024)   Received from [facility]   Overall Financial Resource Strain (CARDIA)    Difficulty of Paying Living Expenses: Not hard at all  Food Insecurity: No Food Insecurity (04/18/2024)   [facility]   Hunger Vital Sign    Within the past 12 months, you worried that your food would run out before you got the money to buy more.: Never true     Within the past 12 months, the food you bought just didn't last and you didn't have money to get more.: Never true  Transportation Needs: No Transportation Needs (04/18/2024)   Received from [facility] - Transportation    In the past 12 months, has lack of transportation kept you from medical appointments or from getting medications?: No    Lack of Transportation (Non-Medical): No  Physical Activity: Not on file  Stress: Not on file  Social Connections: Not on file  Intimate Partner Violence: Not on file    Review of Systems  Constitutional: Negative.  Negative for chills, fever and malaise/fatigue.  HENT:  Positive for nosebleeds. Negative for congestion and sore throat.   Eyes: Negative.  Negative for blurred vision and  pain.  Respiratory: Negative.  Negative for cough and shortness of breath.   Cardiovascular: Negative.  Negative for chest pain, palpitations and leg swelling.  Gastrointestinal: Negative.  Negative for abdominal pain, blood in stool, constipation, diarrhea, heartburn, melena, nausea and vomiting.  Genitourinary: Negative.  Negative for dysuria, flank pain, frequency and urgency.  Musculoskeletal: Negative.  Negative for joint pain and myalgias.  Skin: Negative.   Neurological: Negative.  Negative for dizziness, tingling, sensory change, weakness and headaches.  Endo/Heme/Allergies: Negative.   Psychiatric/Behavioral: Negative.  Negative for depression and suicidal ideas. The patient is not nervous/anxious.         Objective   BP 106/68   Pulse 70   Ht 5' 3 (1.6 m)   Wt 257 lb 12.8 oz (116.9 kg)   SpO2 97%   BMI 45.67 kg/m   Physical Exam Vitals and nursing note reviewed.  Constitutional:      Appearance: Normal appearance.  HENT:     Head: Normocephalic and atraumatic.     Nose: Nose normal.     Mouth/Throat:     Mouth: Mucous membranes are moist.     Pharynx: Oropharynx is clear.  Eyes:     Conjunctiva/sclera:  Conjunctivae normal.     Pupils: Pupils are equal, round, and reactive to light.  Cardiovascular:     Rate and Rhythm: Normal rate and regular rhythm.     Pulses: Normal pulses.     Heart sounds: Normal heart sounds. No murmur heard. Pulmonary:     Effort: Pulmonary effort is normal.     Breath sounds: Normal breath sounds. No wheezing.  Abdominal:     General: Bowel sounds are normal.     Palpations: Abdomen is soft.     Tenderness: There is no abdominal tenderness. There is no right CVA tenderness or left CVA tenderness.  Musculoskeletal:        General: Normal range of motion.     Cervical back: Normal range of motion.     Right lower leg: No edema.     Left lower leg: No edema.  Skin:    General: Skin is warm and dry.  Neurological:     General: No focal deficit present.     Mental Status: She is alert and oriented to person, place, and time.  Psychiatric:        Mood and Affect: Mood normal.        Behavior: Behavior normal.        Assessment & Plan:  Check labs today. Increase dose of Ozempic  to 1 mg/week. Problem List Items Addressed This Visit     Morbid obesity (HCC)   Relevant Medications   Semaglutide , 1 MG/DOSE, 4 MG/3ML SOPN   GERD (gastroesophageal reflux disease)   Relevant Medications   psyllium (METAMUCIL) 58.6 % packet   Essential hypertension, benign - Primary   Relevant Medications   atorvastatin (LIPITOR) 10 MG tablet   spironolactone (ALDACTONE) 50 MG tablet   Other Relevant Orders   CMP14+EGFR   Seasonal allergic rhinitis due to pollen   Mixed hyperlipidemia   Relevant Medications   atorvastatin (LIPITOR) 10 MG tablet   spironolactone (ALDACTONE) 50 MG tablet   Other Relevant Orders   Lipid Panel w/o Chol/HDL Ratio   Myalgia   Relevant Orders   CBC with Diff   CK, total   Prediabetes   Relevant Medications   Semaglutide , 1 MG/DOSE, 4 MG/3ML SOPN   Other Relevant Orders   Hemoglobin A1c  POC CREATINE & ALBUMIN,URINE (Completed)    Chronic right-sided low back pain   Relevant Orders   POCT Urinalysis Dipstick (18997) (Completed)    Return in about 1 month (around 11/15/2024).   Total time spent: 30 minutes. This time includes review of previous notes and results and patient face to face interaction during today's visit.    FERNAND FREDY RAMAN, MD  10/16/2024   This document may have been prepared by [facility] Voice Recognition software and as such may include unintentional dictation errors.

## 2024-10-16 NOTE — Progress Notes (Signed)
 Patient notified

## 2024-10-19 ENCOUNTER — Other Ambulatory Visit

## 2024-10-20 LAB — CBC WITH DIFFERENTIAL/PLATELET
Basophils Absolute: 0 x10E3/uL (ref 0.0–0.2)
Basos: 1 %
EOS (ABSOLUTE): 0.1 x10E3/uL (ref 0.0–0.4)
Eos: 1 %
Hematocrit: 46.5 % (ref 34.0–46.6)
Hemoglobin: 15.1 g/dL (ref 11.1–15.9)
Immature Grans (Abs): 0 x10E3/uL (ref 0.0–0.1)
Immature Granulocytes: 0 %
Lymphocytes Absolute: 1.4 x10E3/uL (ref 0.7–3.1)
Lymphs: 22 %
MCH: 31.4 pg (ref 26.6–33.0)
MCHC: 32.5 g/dL (ref 31.5–35.7)
MCV: 97 fL (ref 79–97)
Monocytes Absolute: 0.6 x10E3/uL (ref 0.1–0.9)
Monocytes: 9 %
Neutrophils Absolute: 4.4 x10E3/uL (ref 1.4–7.0)
Neutrophils: 67 %
Platelets: 231 x10E3/uL (ref 150–450)
RBC: 4.81 x10E6/uL (ref 3.77–5.28)
RDW: 13.8 % (ref 11.7–15.4)
WBC: 6.5 x10E3/uL (ref 3.4–10.8)

## 2024-10-20 LAB — CMP14+EGFR
ALT: 25 IU/L (ref 0–32)
AST: 27 IU/L (ref 0–40)
Albumin: 4.4 g/dL (ref 3.8–4.8)
Alkaline Phosphatase: 98 IU/L (ref 49–135)
BUN/Creatinine Ratio: 12 (ref 12–28)
BUN: 13 mg/dL (ref 8–27)
Bilirubin Total: 0.8 mg/dL (ref 0.0–1.2)
CO2: 17 mmol/L — ABNORMAL LOW (ref 20–29)
Calcium: 9.7 mg/dL (ref 8.7–10.3)
Chloride: 102 mmol/L (ref 96–106)
Creatinine, Ser: 1.1 mg/dL — ABNORMAL HIGH (ref 0.57–1.00)
Globulin, Total: 2.1 g/dL (ref 1.5–4.5)
Glucose: 72 mg/dL (ref 70–99)
Potassium: 4.8 mmol/L (ref 3.5–5.2)
Sodium: 137 mmol/L (ref 134–144)
Total Protein: 6.5 g/dL (ref 6.0–8.5)
eGFR: 53 mL/min/1.73 — ABNORMAL LOW (ref >59)

## 2024-10-20 LAB — LIPID PANEL W/O CHOL/HDL RATIO
Cholesterol, Total: 134 mg/dL (ref 100–199)
HDL: 53 mg/dL (ref >39)
LDL Chol Calc (NIH): 69 mg/dL (ref 0–99)
Triglycerides: 54 mg/dL (ref 0–149)
VLDL Cholesterol Cal: 12 mg/dL (ref 5–40)

## 2024-10-20 LAB — CK: Total CK: 87 U/L (ref 32–182)

## 2024-10-20 LAB — HEMOGLOBIN A1C
Est. average glucose Bld gHb Est-mCnc: 111 mg/dL
Hgb A1c MFr Bld: 5.5 % (ref 4.8–5.6)

## 2024-10-20 NOTE — Progress Notes (Signed)
 Patient notified

## 2024-10-30 ENCOUNTER — Ambulatory Visit

## 2024-11-06 ENCOUNTER — Ambulatory Visit

## 2024-11-13 ENCOUNTER — Ambulatory Visit

## 2024-11-16 ENCOUNTER — Ambulatory Visit: Admitting: Internal Medicine

## 2024-11-27 ENCOUNTER — Ambulatory Visit

## 2024-12-04 ENCOUNTER — Encounter: Payer: Self-pay | Admitting: Internal Medicine

## 2024-12-04 ENCOUNTER — Ambulatory Visit: Admitting: Internal Medicine

## 2024-12-04 VITALS — BP 115/72 | HR 70 | Ht 63.0 in | Wt 254.0 lb

## 2024-12-04 DIAGNOSIS — R7303 Prediabetes: Secondary | ICD-10-CM | POA: Diagnosis not present

## 2024-12-04 DIAGNOSIS — R110 Nausea: Secondary | ICD-10-CM | POA: Diagnosis not present

## 2024-12-04 DIAGNOSIS — I10 Essential (primary) hypertension: Secondary | ICD-10-CM | POA: Diagnosis not present

## 2024-12-04 DIAGNOSIS — E782 Mixed hyperlipidemia: Secondary | ICD-10-CM

## 2024-12-04 DIAGNOSIS — G4733 Obstructive sleep apnea (adult) (pediatric): Secondary | ICD-10-CM

## 2024-12-04 DIAGNOSIS — K219 Gastro-esophageal reflux disease without esophagitis: Secondary | ICD-10-CM | POA: Diagnosis not present

## 2024-12-04 MED ORDER — ONDANSETRON HCL 4 MG PO TABS: 4.0000 mg | ORAL_TABLET | Freq: Three times a day (TID) | ORAL | 3 refills | Status: AC | PRN

## 2024-12-04 NOTE — Progress Notes (Signed)
 "  Established Patient Office Visit  Subjective:  Patient ID: Cindy Anthony, female    DOB: 10-23-1951  Age: 74 y.o. MRN: [id number]  No chief complaint on file.   Patient comes in for her follow-up today.  She reports of being diagnosed with flu about 2 weeks ago and was prescribed Tamiflu at an urgent care.  Today she is feeling better but still gets tired at times.  Patient is currently taking Ozempic  1 mg/week and has lost some weight.  However she admits to nausea, bloating and some constipation.  She has increased her water intake and takes Gaviscon and probiotics.  Will prescribe Zofran  for nausea and encourage p.o. fluids. Patient reports of feeling sleepy during the daytime and excessive loud snoring at night.  She used to have obstructive sleep apnea and used CPAP machine in the past but had stopped using it several years ago.  Agrees to get another sleep study done and get treated for obstructive sleep apnea.  Will schedule a sleep study.    No other concerns at this time.   Past Medical History:  Diagnosis Date   Anxiety    Arthritis    OSTEOARTHRITIS   Benign paroxysmal positional vertigo    Depression    GERD (gastroesophageal reflux disease)    Hemorrhoids    History of chicken pox    Hyperlipidemia    Hypertension    Piriformis syndrome of both sides    Sleep apnea     Past Surgical History:  Procedure Laterality Date   ABDOMINAL HYSTERECTOMY     BARIATRIC SURGERY  2013   sleeve   COLONOSCOPY WITH PROPOFOL  N/A 11/28/2018   Procedure: COLONOSCOPY WITH PROPOFOL ;  Surgeon: Viktoria Lamar DASEN, MD;  Location: [facility] ENDOSCOPY;  Service: Endoscopy;  Laterality: N/A;   ESOPHAGOGASTRODUODENOSCOPY (EGD) WITH PROPOFOL  N/A 11/28/2018   Procedure: ESOPHAGOGASTRODUODENOSCOPY (EGD) WITH PROPOFOL ;  Surgeon: Viktoria Lamar DASEN, MD;  Location: [facility] ENDOSCOPY;  Service: Endoscopy;  Laterality: N/A;   JOINT REPLACEMENT     SHOULDER ARTHROSCOPY Right 2007   TOTAL KNEE ARTHROPLASTY Right  2009    Social History   Socioeconomic History   Marital status: Divorced    Spouse name: Not on file   Number of children: 3   Years of education: Not on file   Highest education level: Not on file  Occupational History   Occupation: education officer, environmental  Tobacco Use   Smoking status: Never   Smokeless tobacco: Never  Vaping Use   Vaping status: Never Used  Substance and Sexual Activity   Alcohol use: No    Alcohol/week: 0.0 standard drinks of alcohol   Drug use: No   Sexual activity: Not on file  Other Topics Concern   Not on file  Social History Narrative   Not on file   Social Drivers of Health   Tobacco Use: Low Risk (12/04/2024)   Patient History    Smoking Tobacco Use: Never    Smokeless Tobacco Use: Never    Passive Exposure: Not on file  Financial Resource Strain: Low Risk  (04/18/2024)   Received from [facility]   Overall Financial Resource Strain (CARDIA)    Difficulty of Paying Living Expenses: Not hard at all  Food Insecurity: No Food Insecurity (04/18/2024)   Received from [facility]   Epic    Within the past 12 months, you worried that your food would run out before you got the money to buy more.: Never true  Within the past 12 months, the food you bought just didn't last and you didn't have money to get more.: Never true  Transportation Needs: No Transportation Needs (04/18/2024)   Received from [facility] - Transportation    In the past 12 months, has lack of transportation kept you from medical appointments or from getting medications?: No    Lack of Transportation (Non-Medical): No  Physical Activity: Not on file  Stress: Not on file  Social Connections: Not on file  Intimate Partner Violence: Not on file  Depression (PHQ2-9): Medium Risk (10/16/2024)   Depression (PHQ2-9)    PHQ-2 Score: 5  Alcohol Screen: Not on file  Housing: Low Risk  (08/08/2024)   Received from [facility]   Epic    In the last 12 months, was there a time when you were not able to pay the mortgage or rent on time?: No    In the past 12 months, how many times have you moved where you were living?: 0    At any time in the past 12 months, were you homeless or living in a shelter (including now)?: No  Utilities: Not At Risk (04/18/2024)   Received from [facility] Utilities    Threatened with loss of utilities: No  Health Literacy: Not on file    Family History  Problem Relation Age of Onset   Arthritis Mother    Hyperlipidemia Mother    Heart disease Mother    Stroke Mother    Hypertension Mother    Kidney disease Mother    Diabetes Mother    Colon polyps Mother    Heart attack Mother    Arthritis Father    Heart disease Father    Diabetes Paternal Grandmother    Hyperlipidemia Sister    Hypertension Brother     Allergies[1]  Show/hide medication list[2]  Review of Systems  Constitutional:  Positive for malaise/fatigue. Negative for chills and fever.  HENT: Negative.  Negative for congestion and sore throat.   Eyes: Negative.  Negative for blurred vision and pain.  Respiratory: Negative.  Negative for cough and shortness of breath.   Cardiovascular: Negative.  Negative for chest pain, palpitations and leg swelling.  Gastrointestinal: Negative.  Negative for abdominal pain, blood in stool, constipation, diarrhea, heartburn, melena, nausea and vomiting.  Genitourinary: Negative.  Negative for dysuria, flank pain, frequency and urgency.  Musculoskeletal: Negative.  Negative for joint pain and myalgias.  Skin: Negative.   Neurological: Negative.  Negative for dizziness, tingling, sensory change, weakness and headaches.  Endo/Heme/Allergies: Negative.   Psychiatric/Behavioral: Negative.  Negative for depression and suicidal ideas. The patient is not nervous/anxious.        Objective:   BP 115/72   Pulse 70   Ht 5' 3 (1.6 m)    Wt 254 lb (115.2 kg)   BMI 44.99 kg/m   Vitals:   12/04/24 1247 12/04/24 1251  BP: 115/70 115/72  Pulse: 70 70  Height: 5' 3 (1.6 m) 5' 3 (1.6 m)  Weight: 254 lb (115.2 kg) 254 lb (115.2 kg)  BMI (Calculated): 45.01 45.01    Physical Exam Vitals and nursing note reviewed.  Constitutional:      Appearance: Normal appearance.  HENT:     Head: Normocephalic and atraumatic.     Nose: Nose normal.     Mouth/Throat:     Mouth: Mucous membranes are moist.  Pharynx: Oropharynx is clear.  Eyes:     Conjunctiva/sclera: Conjunctivae normal.     Pupils: Pupils are equal, round, and reactive to light.  Cardiovascular:     Rate and Rhythm: Normal rate and regular rhythm.     Pulses: Normal pulses.     Heart sounds: Normal heart sounds. No murmur heard. Pulmonary:     Effort: Pulmonary effort is normal.     Breath sounds: Normal breath sounds. No wheezing.  Abdominal:     General: Bowel sounds are normal.     Palpations: Abdomen is soft.     Tenderness: There is no abdominal tenderness. There is no right CVA tenderness or left CVA tenderness.  Musculoskeletal:        General: Normal range of motion.     Cervical back: Normal range of motion.     Right lower leg: No edema.     Left lower leg: No edema.  Skin:    General: Skin is warm and dry.  Neurological:     General: No focal deficit present.     Mental Status: She is alert and oriented to person, place, and time.  Psychiatric:        Mood and Affect: Mood normal.        Behavior: Behavior normal.      No results found for any visits on 12/04/24.  Recent Results (from the past 2160 hours)  Urinalysis, Routine w reflex microscopic -Urine, Clean Catch     Status: Abnormal   Collection Time: 09/15/24  8:00 AM  Result Value Ref Range   Color, Urine YELLOW (A) YELLOW   APPearance CLEAR (A) CLEAR   Specific Gravity, Urine 1.023 1.005 - 1.030   pH 5.0 5.0 - 8.0   Glucose, UA NEGATIVE NEGATIVE mg/dL   Hgb urine  dipstick NEGATIVE NEGATIVE   Bilirubin Urine NEGATIVE NEGATIVE   Ketones, ur NEGATIVE NEGATIVE mg/dL   Protein, ur NEGATIVE NEGATIVE mg/dL   Nitrite NEGATIVE NEGATIVE   Leukocytes,Ua NEGATIVE NEGATIVE    Comment: Performed at [facility], [address] [place], [place] [address]  POC CREATINE & ALBUMIN,URINE     Status: None   Collection Time: 10/16/24 10:40 AM  Result Value Ref Range   Microalbumin Ur, POC 10 mg/L   Creatinine, POC 300 mg/dL   Albumin/Creatinine Ratio, Urine, POC <30   POCT Urinalysis Dipstick (18997)     Status: None   Collection Time: 10/16/24 10:41 AM  Result Value Ref Range   Color, UA Yellow    Clarity, UA Clear    Glucose, UA Negative Negative   Bilirubin, UA Negative    Ketones, UA Negative    Spec Grav, UA 1.025 1.010 - 1.025   Blood, UA Negative    pH, UA 6.0 5.0 - 8.0   Protein, UA Negative Negative   Urobilinogen, UA 0.2 0.2 or 1.0 E.U./dL   Nitrite, UA Negative    Leukocytes, UA Negative Negative   Appearance Clear    Odor No   CMP14+EGFR     Status: Abnormal   Collection Time: 10/19/24  8:29 AM  Result Value Ref Range   Glucose 72 70 - 99 mg/dL   BUN 13 8 - 27 mg/dL   Creatinine, Ser 8.89 (H) 0.57 - 1.00 mg/dL   eGFR 53 (L) >40 fO/fpw/8.26   BUN/Creatinine Ratio 12 12 - 28   Sodium 137 134 - 144 mmol/L   Potassium 4.8 3.5 - 5.2 mmol/L   Chloride  102 96 - 106 mmol/L   CO2 17 (L) 20 - 29 mmol/L   Calcium 9.7 8.7 - 10.3 mg/dL   Total Protein 6.5 6.0 - 8.5 g/dL   Albumin 4.4 3.8 - 4.8 g/dL   Globulin, Total 2.1 1.5 - 4.5 g/dL   Bilirubin Total 0.8 0.0 - 1.2 mg/dL   Alkaline Phosphatase 98 49 - 135 IU/L   AST 27 0 - 40 IU/L   ALT 25 0 - 32 IU/L  CBC with Diff     Status: None   Collection Time: 10/19/24  8:29 AM  Result Value Ref Range   WBC 6.5 3.4 - 10.8 x10E3/uL   RBC 4.81 3.77 - 5.28 x10E6/uL   Hemoglobin 15.1 11.1 - 15.9 g/dL   Hematocrit 53.4 65.9 - 46.6 %   MCV 97 79 - 97 fL   MCH 31.4 26.6 - 33.0 pg   MCHC 32.5  31.5 - 35.7 g/dL   RDW 86.1 88.2 - 84.5 %   Platelets 231 150 - 450 x10E3/uL   Neutrophils 67 Not Estab. %   Lymphs 22 Not Estab. %   Monocytes 9 Not Estab. %   Eos 1 Not Estab. %   Basos 1 Not Estab. %   Neutrophils Absolute 4.4 1.4 - 7.0 x10E3/uL   Lymphocytes Absolute 1.4 0.7 - 3.1 x10E3/uL   Monocytes Absolute 0.6 0.1 - 0.9 x10E3/uL   EOS (ABSOLUTE) 0.1 0.0 - 0.4 x10E3/uL   Basophils Absolute 0.0 0.0 - 0.2 x10E3/uL   Immature Granulocytes 0 Not Estab. %   Immature Grans (Abs) 0.0 0.0 - 0.1 x10E3/uL  Lipid Panel w/o Chol/HDL Ratio     Status: None   Collection Time: 10/19/24  8:29 AM  Result Value Ref Range   Cholesterol, Total 134 100 - 199 mg/dL   Triglycerides 54 0 - 149 mg/dL   HDL 53 >60 mg/dL   VLDL Cholesterol Cal 12 5 - 40 mg/dL   LDL Chol Calc (NIH) 69 0 - 99 mg/dL  Hemoglobin J8r     Status: None   Collection Time: 10/19/24  8:29 AM  Result Value Ref Range   Hgb A1c MFr Bld 5.5 4.8 - 5.6 %    Comment:          Prediabetes: 5.7 - 6.4          Diabetes: >6.4          Glycemic control for adults with diabetes: <7.0    Est. average glucose Bld gHb Est-mCnc 111 mg/dL  CK, total     Status: None   Collection Time: 10/19/24  8:29 AM  Result Value Ref Range   Total CK 87 32 - 182 U/L      Assessment & Plan:  Continue Ozempic  at 1 mg/week.  Prescription sent for Zofran  to be taken as needed for nausea.  Meanwhile continue Gaviscon, probiotics and stool softeners if needed.  Schedule sleep study for obstructive sleep apnea. Problem List Items Addressed This Visit       Cardiovascular and Mediastinum   Essential hypertension, benign - Primary     Digestive   GERD (gastroesophageal reflux disease)   Relevant Medications   ondansetron  (ZOFRAN ) 4 MG tablet     Other   Morbid obesity (HCC)   Mixed hyperlipidemia   Prediabetes   Other Visit Diagnoses       Nausea       Relevant Medications   ondansetron  (ZOFRAN ) 4 MG tablet  OSA (obstructive sleep apnea)        Relevant Orders   Ambulatory referral to Sleep Studies       Return in about 1 month (around 01/04/2025).   Total time spent: 30 minutes. This time includes review of previous notes and results and patient face to face interaction during today's visit.    FERNAND FREDY RAMAN, MD  12/04/2024   This document may have been prepared by [facility] Voice Recognition software and as such may include unintentional dictation errors.     [1]  Allergies Allergen Reactions   Amlodipine  Diarrhea   Lisinopril Other (See Comments)    Dizziness   Other Other (See Comments)   Shellfish Protein-Containing Drug Products Other (See Comments)  [2]  Outpatient Medications Prior to Visit  Medication Sig   atorvastatin (LIPITOR) 10 MG tablet Take 10 mg by mouth daily.   calcium carbonate (OSCAL) 1500 (600 Ca) MG TABS tablet Take by mouth 2 (two) times daily with a meal.   co-enzyme Q-10 30 MG capsule Take 30 mg by mouth daily.   cyanocobalamin (VITAMIN B12) 1000 MCG tablet Take 1,000 mcg by mouth daily.   irbesartan (AVAPRO) 300 MG tablet Take 300 mg by mouth at bedtime.   Multiple Vitamin (MULTIVITAMIN) tablet Take 1 tablet by mouth daily.   psyllium (METAMUCIL) 58.6 % packet Take 1 packet by mouth daily.   Semaglutide , 1 MG/DOSE, 4 MG/3ML SOPN Inject 1 mg into the skin once a week.   spironolactone (ALDACTONE) 50 MG tablet Take 50 mg by mouth daily.   No facility-administered medications prior to visit.   "

## 2024-12-14 ENCOUNTER — Encounter: Payer: Self-pay | Admitting: Internal Medicine

## 2024-12-15 ENCOUNTER — Other Ambulatory Visit: Payer: Self-pay

## 2024-12-15 MED ORDER — ATORVASTATIN CALCIUM 10 MG PO TABS: 10.0000 mg | ORAL_TABLET | Freq: Every day | ORAL | 3 refills | Status: AC

## 2024-12-22 ENCOUNTER — Ambulatory Visit: Admitting: Cardiology

## 2024-12-22 ENCOUNTER — Encounter: Payer: Self-pay | Admitting: Cardiology

## 2024-12-22 VITALS — BP 118/62 | HR 82 | Ht 63.0 in | Wt 254.0 lb

## 2024-12-22 DIAGNOSIS — I10 Essential (primary) hypertension: Secondary | ICD-10-CM | POA: Diagnosis not present

## 2024-12-22 DIAGNOSIS — M5442 Lumbago with sciatica, left side: Secondary | ICD-10-CM

## 2024-12-22 DIAGNOSIS — M5441 Lumbago with sciatica, right side: Secondary | ICD-10-CM | POA: Diagnosis not present

## 2024-12-22 DIAGNOSIS — G8929 Other chronic pain: Secondary | ICD-10-CM | POA: Diagnosis not present

## 2024-12-22 MED ORDER — NAPROXEN 500 MG PO TABS: 500.0000 mg | ORAL_TABLET | Freq: Two times a day (BID) | ORAL | 2 refills | Status: AC

## 2024-12-22 NOTE — Progress Notes (Signed)
 "  Established Patient Office Visit  Subjective:  Patient ID: Cindy Anthony, female    DOB: Jul 23, 1951  Age: 74 y.o. MRN: [id number]  Chief Complaint  Patient presents with   Acute Visit    R Side Sciatic Pain x 7 days    Patient in office for an acute visit, complaining of right side sciatic pain that started 7 days ago. Patient has a remote history of right low back pain with sciatica. Most recent episode sent patient to the ED in October 2025. Xray during that visit Mild spondylosis of the lumbar spine with mild disc disease at the L3-4 level. Grade 1 anterolisthesis of L4 on L5. Patient was given a Toradol  injection at the ED, lidocaine  patch placed. Sent home with naproxen  and diazepam .  Patient comes in today complaining of right lower back pain with sciatica. Has been using a heating pad and salonpas with short term relief.  Offered patient a muscle relaxer, patient declined. Recommended PT, patient declined. Patient states she cannot take most antiinflammatory medications due to they elevate her blood pressure. Has taken and tolerated naproxen  before. Will send in Naproxen . Patient has seen a chiropractor before, states she will schedule an appointment with them.  Blood pressure well controlled today.   Back Pain The current episode started in the past 7 days. The problem occurs constantly. The problem is unchanged. The pain is present in the lumbar spine and gluteal. The pain radiates to the right thigh and right knee. The pain is at a severity of 9/10. The pain is severe. The symptoms are aggravated by lying down and sitting. Associated symptoms include tingling. Pertinent negatives include no abdominal pain, bladder incontinence, bowel incontinence, chest pain or headaches. Treatments tried: heating pad, solonpas, rubbing cream. The treatment provided no relief.    No other concerns at this time.   Past Medical History:  Diagnosis Date   Anxiety    Arthritis    OSTEOARTHRITIS    Benign paroxysmal positional vertigo    Depression    GERD (gastroesophageal reflux disease)    Hemorrhoids    History of chicken pox    Hyperlipidemia    Hypertension    Piriformis syndrome of both sides    Sleep apnea     Past Surgical History:  Procedure Laterality Date   ABDOMINAL HYSTERECTOMY     BARIATRIC SURGERY  2013   sleeve   COLONOSCOPY WITH PROPOFOL  N/A 11/28/2018   Procedure: COLONOSCOPY WITH PROPOFOL ;  Surgeon: Viktoria Lamar DASEN, MD;  Location: [facility] ENDOSCOPY;  Service: Endoscopy;  Laterality: N/A;   ESOPHAGOGASTRODUODENOSCOPY (EGD) WITH PROPOFOL  N/A 11/28/2018   Procedure: ESOPHAGOGASTRODUODENOSCOPY (EGD) WITH PROPOFOL ;  Surgeon: Viktoria Lamar DASEN, MD;  Location: [facility] ENDOSCOPY;  Service: Endoscopy;  Laterality: N/A;   JOINT REPLACEMENT     SHOULDER ARTHROSCOPY Right 2007   TOTAL KNEE ARTHROPLASTY Right 2009    Social History   Socioeconomic History   Marital status: Divorced    Spouse name: Not on file   Number of children: 3   Years of education: Not on file   Highest education level: Not on file  Occupational History   Occupation: education officer, environmental  Tobacco Use   Smoking status: Never   Smokeless tobacco: Never  Vaping Use   Vaping status: Never Used  Substance and Sexual Activity   Alcohol use: No    Alcohol/week: 0.0 standard drinks of alcohol   Drug use: No   Sexual activity: Not on file  Other Topics Concern  Not on file  Social History Narrative   Not on file   Social Drivers of Health   Tobacco Use: Low Risk (12/22/2024)   Patient History    Smoking Tobacco Use: Never    Smokeless Tobacco Use: Never    Passive Exposure: Not on file  Financial Resource Strain: Low Risk  (04/18/2024)   Received from [facility]   Overall Financial Resource Strain (CARDIA)    Difficulty of Paying Living Expenses: Not hard at all  Food Insecurity: No Food Insecurity (04/18/2024)   Received from [facility]   Epic     Within the past 12 months, you worried that your food would run out before you got the money to buy more.: Never true    Within the past 12 months, the food you bought just didn't last and you didn't have money to get more.: Never true  Transportation Needs: No Transportation Needs (04/18/2024)   Received from [facility] - Transportation    In the past 12 months, has lack of transportation kept you from medical appointments or from getting medications?: No    Lack of Transportation (Non-Medical): No  Physical Activity: Not on file  Stress: Not on file  Social Connections: Not on file  Intimate Partner Violence: Not on file  Depression (PHQ2-9): Medium Risk (10/16/2024)   Depression (PHQ2-9)    PHQ-2 Score: 5  Alcohol Screen: Not on file  Housing: Low Risk  (08/08/2024)   Received from [facility]   Epic    In the last 12 months, was there a time when you were not able to pay the mortgage or rent on time?: No    In the past 12 months, how many times have you moved where you were living?: 0    At any time in the past 12 months, were you homeless or living in a shelter (including now)?: No  Utilities: Not At Risk (04/18/2024)   Received from [facility] Utilities    Threatened with loss of utilities: No  Health Literacy: Not on file    Family History  Problem Relation Age of Onset   Arthritis Mother    Hyperlipidemia Mother    Heart disease Mother    Stroke Mother    Hypertension Mother    Kidney disease Mother    Diabetes Mother    Colon polyps Mother    Heart attack Mother    Arthritis Father    Heart disease Father    Diabetes Paternal Grandmother    Hyperlipidemia Sister    Hypertension Brother     Allergies[1]  Show/hide medication list[2]  Review of Systems  Constitutional: Negative.   HENT: Negative.    Eyes: Negative.   Respiratory: Negative.  Negative for shortness of breath.    Cardiovascular: Negative.  Negative for chest pain.  Gastrointestinal: Negative.  Negative for abdominal pain, bowel incontinence, constipation and diarrhea.  Genitourinary: Negative.  Negative for bladder incontinence.  Musculoskeletal:  Positive for back pain. Negative for joint pain and myalgias.  Skin: Negative.   Neurological:  Positive for tingling. Negative for dizziness and headaches.  Endo/Heme/Allergies: Negative.   All other systems reviewed and are negative.      Objective:   BP 118/62   Pulse 82   Ht 5' 3 (1.6 m)   Wt 254 lb (115.2 kg)   SpO2 98%   BMI 44.99 kg/m  Vitals:   12/22/24 1304  BP: 118/62  Pulse: 82  Height: 5' 3 (1.6 m)  Weight: 254 lb (115.2 kg)  SpO2: 98%  BMI (Calculated): 45.01    Physical Exam Vitals and nursing note reviewed.  Constitutional:      Appearance: Normal appearance. She is normal weight.  HENT:     Head: Normocephalic and atraumatic.     Nose: Nose normal.     Mouth/Throat:     Mouth: Mucous membranes are moist.  Eyes:     Extraocular Movements: Extraocular movements intact.     Conjunctiva/sclera: Conjunctivae normal.     Pupils: Pupils are equal, round, and reactive to light.  Cardiovascular:     Rate and Rhythm: Normal rate and regular rhythm.     Pulses: Normal pulses.     Heart sounds: Normal heart sounds.  Pulmonary:     Effort: Pulmonary effort is normal.     Breath sounds: Normal breath sounds.  Abdominal:     General: Abdomen is flat. Bowel sounds are normal.     Palpations: Abdomen is soft.  Musculoskeletal:        General: Normal range of motion.     Cervical back: Normal range of motion.  Skin:    General: Skin is warm and dry.  Neurological:     General: No focal deficit present.     Mental Status: She is alert and oriented to person, place, and time.  Psychiatric:        Mood and Affect: Mood normal.        Behavior: Behavior normal.        Thought Content: Thought content normal.         Judgment: Judgment normal.      No results found for any visits on 12/22/24.  Recent Results (from the past 2160 hours)  POC CREATINE & ALBUMIN,URINE     Status: None   Collection Time: 10/16/24 10:40 AM  Result Value Ref Range   Microalbumin Ur, POC 10 mg/L   Creatinine, POC 300 mg/dL   Albumin/Creatinine Ratio, Urine, POC <30   POCT Urinalysis Dipstick (18997)     Status: None   Collection Time: 10/16/24 10:41 AM  Result Value Ref Range   Color, UA Yellow    Clarity, UA Clear    Glucose, UA Negative Negative   Bilirubin, UA Negative    Ketones, UA Negative    Spec Grav, UA 1.025 1.010 - 1.025   Blood, UA Negative    pH, UA 6.0 5.0 - 8.0   Protein, UA Negative Negative   Urobilinogen, UA 0.2 0.2 or 1.0 E.U./dL   Nitrite, UA Negative    Leukocytes, UA Negative Negative   Appearance Clear    Odor No   CMP14+EGFR     Status: Abnormal   Collection Time: 10/19/24  8:29 AM  Result Value Ref Range   Glucose 72 70 - 99 mg/dL   BUN 13 8 - 27 mg/dL   Creatinine, Ser 8.89 (H) 0.57 - 1.00 mg/dL   eGFR 53 (L) >40 fO/fpw/8.26   BUN/Creatinine Ratio 12 12 - 28   Sodium 137 134 - 144 mmol/L   Potassium 4.8 3.5 - 5.2 mmol/L   Chloride 102 96 - 106 mmol/L   CO2 17 (L) 20 - 29 mmol/L   Calcium  9.7 8.7 - 10.3 mg/dL   Total Protein 6.5 6.0 - 8.5 g/dL   Albumin 4.4 3.8 - 4.8 g/dL   Globulin, Total 2.1  1.5 - 4.5 g/dL   Bilirubin Total 0.8 0.0 - 1.2 mg/dL   Alkaline Phosphatase 98 49 - 135 IU/L   AST 27 0 - 40 IU/L   ALT 25 0 - 32 IU/L  CBC with Diff     Status: None   Collection Time: 10/19/24  8:29 AM  Result Value Ref Range   WBC 6.5 3.4 - 10.8 x10E3/uL   RBC 4.81 3.77 - 5.28 x10E6/uL   Hemoglobin 15.1 11.1 - 15.9 g/dL   Hematocrit 53.4 65.9 - 46.6 %   MCV 97 79 - 97 fL   MCH 31.4 26.6 - 33.0 pg   MCHC 32.5 31.5 - 35.7 g/dL   RDW 86.1 88.2 - 84.5 %   Platelets 231 150 - 450 x10E3/uL   Neutrophils 67 Not Estab. %   Lymphs 22 Not Estab. %   Monocytes 9 Not Estab. %   Eos 1  Not Estab. %   Basos 1 Not Estab. %   Neutrophils Absolute 4.4 1.4 - 7.0 x10E3/uL   Lymphocytes Absolute 1.4 0.7 - 3.1 x10E3/uL   Monocytes Absolute 0.6 0.1 - 0.9 x10E3/uL   EOS (ABSOLUTE) 0.1 0.0 - 0.4 x10E3/uL   Basophils Absolute 0.0 0.0 - 0.2 x10E3/uL   Immature Granulocytes 0 Not Estab. %   Immature Grans (Abs) 0.0 0.0 - 0.1 x10E3/uL  Lipid Panel w/o Chol/HDL Ratio     Status: None   Collection Time: 10/19/24  8:29 AM  Result Value Ref Range   Cholesterol, Total 134 100 - 199 mg/dL   Triglycerides 54 0 - 149 mg/dL   HDL 53 >60 mg/dL   VLDL Cholesterol Cal 12 5 - 40 mg/dL   LDL Chol Calc (NIH) 69 0 - 99 mg/dL  Hemoglobin J8r     Status: None   Collection Time: 10/19/24  8:29 AM  Result Value Ref Range   Hgb A1c MFr Bld 5.5 4.8 - 5.6 %    Comment:          Prediabetes: 5.7 - 6.4          Diabetes: >6.4          Glycemic control for adults with diabetes: <7.0    Est. average glucose Bld gHb Est-mCnc 111 mg/dL  CK, total     Status: None   Collection Time: 10/19/24  8:29 AM  Result Value Ref Range   Total CK 87 32 - 182 U/L      Assessment & Plan:  Naproxen   Problem List Items Addressed This Visit       Cardiovascular and Mediastinum   Essential hypertension, benign     Nervous and Auditory   Right-sided low back pain with bilateral sciatica - Primary   Relevant Medications   naproxen  (NAPROSYN ) 500 MG tablet     Other   Morbid obesity (HCC)   Chronic right-sided low back pain   Relevant Medications   naproxen  (NAPROSYN ) 500 MG tablet    Return if symptoms worsen or fail to improve, for a scheduled with NK.   Total time spent: 25 minutes. This time includes review of previous notes and results and patient face to face interaction during today's visit.    Jeoffrey Pollen, NP  12/22/2024   This document may have been prepared by [facility] Voice Recognition software and as such may include unintentional dictation errors.     [1]  Allergies Allergen  Reactions   Amlodipine  Diarrhea   Lisinopril Other (See Comments)  Dizziness   Other Other (See Comments)   Shellfish Protein-Containing Drug Products Other (See Comments)  [2]  Outpatient Medications Prior to Visit  Medication Sig   atorvastatin  (LIPITOR) 10 MG tablet Take 1 tablet (10 mg total) by mouth daily.   calcium  carbonate (OSCAL) 1500 (600 Ca) MG TABS tablet Take by mouth 2 (two) times daily with a meal.   co-enzyme Q-10 30 MG capsule Take 30 mg by mouth daily.   cyanocobalamin (VITAMIN B12) 1000 MCG tablet Take 1,000 mcg by mouth daily.   irbesartan (AVAPRO) 300 MG tablet Take 300 mg by mouth at bedtime.   Multiple Vitamin (MULTIVITAMIN) tablet Take 1 tablet by mouth daily.   ondansetron  (ZOFRAN ) 4 MG tablet Take 1 tablet (4 mg total) by mouth every 8 (eight) hours as needed for nausea or vomiting.   psyllium (METAMUCIL) 58.6 % packet Take 1 packet by mouth daily.   Semaglutide , 1 MG/DOSE, 4 MG/3ML SOPN Inject 1 mg into the skin once a week.   spironolactone (ALDACTONE) 50 MG tablet Take 50 mg by mouth daily.   No facility-administered medications prior to visit.   "

## 2025-01-01 ENCOUNTER — Ambulatory Visit: Payer: Self-pay | Admitting: Internal Medicine

## 2025-01-01 ENCOUNTER — Encounter: Payer: Self-pay | Admitting: Internal Medicine

## 2025-01-08 ENCOUNTER — Ambulatory Visit: Admitting: Internal Medicine

## 2025-01-15 ENCOUNTER — Ambulatory Visit: Admitting: Internal Medicine

## 2025-03-12 ENCOUNTER — Ambulatory Visit: Admitting: Internal Medicine
